# Patient Record
Sex: Female | Born: 1960
Health system: Southern US, Community
[De-identification: ages and names within clinical notes are randomized; demographics above are authoritative.]

## PROBLEM LIST (undated history)

## (undated) DIAGNOSIS — R87629 Unspecified abnormal cytological findings in specimens from vagina: Secondary | ICD-10-CM

## (undated) DIAGNOSIS — IMO0001 Reserved for inherently not codable concepts without codable children: Secondary | ICD-10-CM

## (undated) DIAGNOSIS — Z464 Encounter for fitting and adjustment of orthodontic device: Secondary | ICD-10-CM

## (undated) DIAGNOSIS — Z973 Presence of spectacles and contact lenses: Secondary | ICD-10-CM

## (undated) HISTORY — DX: Unspecified abnormal cytological findings in specimens from vagina: R87.629

---

## 2005-09-09 ENCOUNTER — Encounter: Payer: Self-pay | Admitting: Specialist

## 2005-09-12 ENCOUNTER — Encounter: Payer: Self-pay | Admitting: Specialist

## 2005-09-22 ENCOUNTER — Ambulatory Visit: Payer: Self-pay | Admitting: Obstetrics and Gynecology

## 2005-10-13 ENCOUNTER — Encounter: Payer: Self-pay | Admitting: Specialist

## 2007-02-06 ENCOUNTER — Ambulatory Visit: Payer: Self-pay | Admitting: Obstetrics and Gynecology

## 2007-11-22 ENCOUNTER — Ambulatory Visit: Payer: Self-pay | Admitting: Obstetrics and Gynecology

## 2008-04-30 ENCOUNTER — Ambulatory Visit: Payer: Self-pay | Admitting: Obstetrics and Gynecology

## 2009-01-22 ENCOUNTER — Ambulatory Visit: Payer: Self-pay | Admitting: Obstetrics and Gynecology

## 2009-05-08 ENCOUNTER — Ambulatory Visit: Payer: Self-pay | Admitting: Obstetrics and Gynecology

## 2009-10-12 ENCOUNTER — Ambulatory Visit: Payer: Self-pay | Admitting: Internal Medicine

## 2010-07-13 ENCOUNTER — Ambulatory Visit: Payer: Self-pay | Admitting: Obstetrics and Gynecology

## 2011-12-01 ENCOUNTER — Ambulatory Visit: Payer: Self-pay

## 2012-03-24 ENCOUNTER — Ambulatory Visit: Payer: Self-pay | Admitting: Obstetrics and Gynecology

## 2013-10-25 LAB — HM PAP SMEAR: HM Pap smear: NEGATIVE

## 2013-12-25 ENCOUNTER — Ambulatory Visit: Payer: Self-pay | Admitting: Obstetrics and Gynecology

## 2014-04-30 ENCOUNTER — Ambulatory Visit: Payer: Self-pay | Admitting: *Deleted

## 2015-05-14 ENCOUNTER — Other Ambulatory Visit: Payer: Self-pay | Admitting: Obstetrics and Gynecology

## 2015-05-14 DIAGNOSIS — Z1231 Encounter for screening mammogram for malignant neoplasm of breast: Secondary | ICD-10-CM

## 2015-05-28 ENCOUNTER — Ambulatory Visit
Admission: RE | Admit: 2015-05-28 | Discharge: 2015-05-28 | Disposition: A | Discharge status: Home / Self Care | Payer: 59 | Source: Ambulatory Visit | Attending: Obstetrics and Gynecology | Admitting: Obstetrics and Gynecology

## 2015-05-28 ENCOUNTER — Other Ambulatory Visit: Payer: Self-pay | Admitting: Obstetrics and Gynecology

## 2015-05-28 DIAGNOSIS — Z1231 Encounter for screening mammogram for malignant neoplasm of breast: Secondary | ICD-10-CM | POA: Diagnosis not present

## 2015-05-30 NOTE — Progress Notes (Signed)
Quick Note:  Please let her know I have reviewed her MMG and it is normal. ______

## 2015-06-02 ENCOUNTER — Encounter: Payer: Self-pay | Admitting: *Deleted

## 2015-07-02 ENCOUNTER — Telehealth: Payer: Self-pay | Admitting: Gastroenterology

## 2015-07-02 ENCOUNTER — Other Ambulatory Visit: Payer: Self-pay

## 2015-07-02 NOTE — Telephone Encounter (Signed)
Gastroenterology Pre-Procedure Review  Request Date: 08-22-2015     Patient would like early morning  Requesting Physician: Dr. Allen Norris  PATIENT REVIEW QUESTIONS: The patient responded to the following health history questions as indicated:    1. Are you having any GI issues? no 2. Do you have a personal history of Polyps? no 3. Do you have a family history of Colon Cancer or Polyps? no 4. Diabetes Mellitus? no 5. Joint replacements in the past 12 months?no 6. Major health problems in the past 3 months?no 7. Any artificial heart valves, MVP, or defibrillator?no    MEDICATIONS & ALLERGIES:    Patient reports the following regarding taking any anticoagulation/antiplatelet therapy:   Plavix, Coumadin, Eliquis, Xarelto, Lovenox, Pradaxa, Brilinta, or Effient? no Aspirin? no  Patient confirms/reports the following medications:  Vitamins No current outpatient prescriptions on file.   No current facility-administered medications for this visit.    Patient confirms/reports the following allergies: NO Allergies not on file  No orders of the defined types were placed in this encounter.    AUTHORIZATION INFORMATION Primary Insurance: [place] 1D#: Group #:  Secondary Insurance: 1D#: Group #:  SCHEDULE INFORMATION: Date: 08-22-2015 Time: Location:[facility]

## 2015-08-12 ENCOUNTER — Encounter: Payer: Self-pay | Admitting: *Deleted

## 2015-08-20 NOTE — Discharge Instructions (Signed)

## 2015-08-22 ENCOUNTER — Ambulatory Visit: Payer: 59 | Admitting: Anesthesiology

## 2015-08-22 ENCOUNTER — Ambulatory Visit
Admission: RE | Admit: 2015-08-22 | Discharge: 2015-08-22 | Disposition: A | Discharge status: Home / Self Care | Payer: 59 | Source: Ambulatory Visit | Attending: Gastroenterology | Admitting: Gastroenterology

## 2015-08-22 ENCOUNTER — Encounter
Admission: RE | Disposition: A | Discharge status: Home / Self Care | Payer: Self-pay | Source: Ambulatory Visit | Attending: Gastroenterology

## 2015-08-22 ENCOUNTER — Encounter: Payer: Self-pay | Admitting: *Deleted

## 2015-08-22 DIAGNOSIS — Z1211 Encounter for screening for malignant neoplasm of colon: Secondary | ICD-10-CM | POA: Diagnosis present

## 2015-08-22 DIAGNOSIS — K640 First degree hemorrhoids: Secondary | ICD-10-CM | POA: Diagnosis not present

## 2015-08-22 HISTORY — DX: Reserved for inherently not codable concepts without codable children: IMO0001

## 2015-08-22 HISTORY — PX: COLONOSCOPY WITH PROPOFOL: SHX5780

## 2015-08-22 HISTORY — DX: Encounter for fitting and adjustment of orthodontic device: Z46.4

## 2015-08-22 HISTORY — DX: Presence of spectacles and contact lenses: Z97.3

## 2015-08-22 SURGERY — COLONOSCOPY WITH PROPOFOL
Anesthesia: Monitor Anesthesia Care | Wound class: Contaminated

## 2015-08-22 MED ORDER — LIDOCAINE HCL (CARDIAC) 20 MG/ML IV SOLN
INTRAVENOUS | Status: DC | PRN
  Administered 2015-08-22: 50 mg via INTRAVENOUS

## 2015-08-22 MED ORDER — LACTATED RINGERS IV SOLN
INTRAVENOUS | Status: DC
  Administered 2015-08-22: 07:00:00 via INTRAVENOUS

## 2015-08-22 MED ORDER — OXYCODONE HCL 5 MG/5ML PO SOLN: 5.0000 mg | Freq: Once | ORAL | Status: DC | PRN

## 2015-08-22 MED ORDER — PROPOFOL 10 MG/ML IV BOLUS
INTRAVENOUS | Status: DC | PRN
  Administered 2015-08-22 (×2): 10 mg via INTRAVENOUS
  Administered 2015-08-22: 40 mg via INTRAVENOUS
  Administered 2015-08-22: 70 mg via INTRAVENOUS
  Administered 2015-08-22: 20 mg via INTRAVENOUS
  Administered 2015-08-22: 10 mg via INTRAVENOUS
  Administered 2015-08-22: 20 mg via INTRAVENOUS
  Administered 2015-08-22: 40 mg via INTRAVENOUS

## 2015-08-22 MED ORDER — STERILE WATER FOR IRRIGATION IR SOLN
Status: DC | PRN
  Administered 2015-08-22: 08:00:00

## 2015-08-22 MED ORDER — OXYCODONE HCL 5 MG PO TABS: 5.0000 mg | ORAL_TABLET | Freq: Once | ORAL | Status: DC | PRN

## 2015-08-22 SURGICAL SUPPLY — 28 items

## 2015-08-22 NOTE — H&P (Signed)
  [facility]  [address] [place], [place] [address] Phone: [phone] Fax : [phone]  Primary Care Physician:  Gennie Alma, CNM Primary Gastroenterologist:  Dr. Allen Norris  Pre-Procedure History & Physical: HPI:  Hannah Chavez is a 54 y.o. female is here for a screening colonoscopy.   Past Medical History  Diagnosis Date  . Wears contact lenses   . Orthodontics     Permanant retainer - top front    History reviewed. No pertinent past surgical history.  Prior to Admission medications   Medication Sig Start Date End Date Taking? Authorizing Provider  Calcium Citrate-Vitamin D (CALCIUM + D PO) Take by mouth.   Yes Historical Provider, MD  Multiple Vitamin (MULTI-VITAMIN DAILY PO) Take by mouth.   Yes Historical Provider, MD  Omega-3 Fatty Acids (FISH OIL PO) Take by mouth.   Yes Historical Provider, MD    Allergies as of 07/02/2015  . (Not on File)    History reviewed. No pertinent family history.  Social History   Social History  . Marital Status: Married    Spouse Name: N/A  . Number of Children: N/A  . Years of Education: N/A   Occupational History  . Not on file.   Social History Main Topics  . Smoking status: Never Smoker   . Smokeless tobacco: Not on file  . Alcohol Use: 4.2 oz/week    7 Glasses of wine per week  . Drug Use: Not on file  . Sexual Activity: Not on file   Other Topics Concern  . Not on file   Social History Narrative    Review of Systems: See HPI, otherwise negative ROS  Physical Exam: BP 108/61 mmHg  Pulse 47  Temp(Src) 97.9 F (36.6 C)  Resp 17  Ht 5\' 9"  (1.753 m)  Wt 129 lb (58.514 kg)  BMI 19.04 kg/m2  SpO2 100% General:   Alert,  pleasant and cooperative in NAD Head:  Normocephalic and atraumatic. Neck:  Supple; no masses or thyromegaly. Lungs:  Clear throughout to auscultation.    Heart:  Regular rate and rhythm. Abdomen:  Soft, nontender and nondistended. Normal bowel sounds, without  guarding, and without rebound.   Neurologic:  Alert and  oriented x4;  grossly normal neurologically.  Impression/Plan: Hannah Chavez is now here to undergo a screening colonoscopy.  Risks, benefits, and alternatives regarding colonoscopy have been reviewed with the patient.  Questions have been answered.  All parties agreeable.

## 2015-08-22 NOTE — Op Note (Signed)
[facility] Gastroenterology Patient Name: Hannah Chavez Procedure Date: 08/22/2015 7:40 AM MRN: [id number] Account #: [id number] Date of Birth: 1961-02-13 Admit Type: Outpatient Age: 54 Room: [facility] OR ROOM 01 Gender: Female Note Status: Finalized Procedure:         Colonoscopy Indications:       Screening for colorectal malignant neoplasm Providers:         Lucilla Lame, MD Medicines:         Propofol per Anesthesia Complications:     No immediate complications. Procedure:         Pre-Anesthesia Assessment:                    - Prior to the procedure, a History and Physical was                     performed, and patient medications and allergies were                     reviewed. The patient's tolerance of previous anesthesia                     was also reviewed. The risks and benefits of the procedure                     and the sedation options and risks were discussed with the                     patient. All questions were answered, and informed consent                     was obtained. Prior Anticoagulants: The patient has taken                     no previous anticoagulant or antiplatelet agents. ASA                     Grade Assessment: II - A patient with mild systemic                     disease. After reviewing the risks and benefits, the                     patient was deemed in satisfactory condition to undergo                     the procedure.                    After obtaining informed consent, the colonoscope was                     passed under direct vision. Throughout the procedure, the                     patient's blood pressure, pulse, and oxygen saturations                     were monitored continuously. The Olympus CF H180AL                     colonoscope (S#: [id number]) was introduced through the anus                     and advanced to the the cecum, identified by  appendiceal                     orifice and ileocecal valve. The  colonoscopy was performed                     without difficulty. The patient tolerated the procedure                     well. The quality of the bowel preparation was excellent. Findings:      The perianal and digital rectal examinations were normal.      Non-bleeding internal hemorrhoids were found during retroflexion. The       hemorrhoids were Grade I (internal hemorrhoids that do not prolapse). Impression:        - Non-bleeding internal hemorrhoids.                    - No specimens collected. Recommendation:    - Repeat colonoscopy in 10 years for screening unless any                     change in family history or lower GI problems. Procedure Code(s): --- Professional ---                    [phone], Colonoscopy, flexible; diagnostic, including                     collection of specimen(s) by brushing or washing, when                     performed (separate procedure) Diagnosis Code(s): --- Professional ---                    Z12.11, Encounter for screening for malignant neoplasm of                     colon CPT copyright 2014 American Medical Association. All rights reserved. The codes documented in this report are preliminary and upon coder review may  be revised to meet current compliance requirements. Lucilla Lame, MD 08/22/2015 7:58:09 AM This report has been signed electronically. Number of Addenda: 0 Note Initiated On: 08/22/2015 7:40 AM Scope Withdrawal Time: 0 hours 6 minutes 57 seconds  Total Procedure Duration: 0 hours 11 minutes 50 seconds       [facility]

## 2015-08-22 NOTE — Anesthesia Procedure Notes (Signed)
Procedure Name: MAC Performed by: Arbie Reisz Pre-anesthesia Checklist: Patient identified, Emergency Drugs available, Suction available, Timeout performed and Patient being monitored Patient Re-evaluated:Patient Re-evaluated prior to inductionOxygen Delivery Method: Nasal cannula Placement Confirmation: positive ETCO2       

## 2015-08-22 NOTE — Anesthesia Postprocedure Evaluation (Signed)
  Anesthesia Post-op Note  Patient: Hannah Chavez  Procedure(s) Performed: Procedure(s) with comments: COLONOSCOPY WITH PROPOFOL (N/A) - PLEASE LEAVE PT EARLY AM PER DEE  Anesthesia type:MAC  Patient location: PACU  Post pain: Pain level controlled  Post assessment: Post-op Vital signs reviewed, Patient's Cardiovascular Status Stable, Respiratory Function Stable, Patent Airway and No signs of Nausea or vomiting  Post vital signs: Reviewed and stable  Last Vitals:  Filed Vitals:   08/22/15 0815  BP: 96/51  Pulse: 55  Temp:   Resp: 13    Level of consciousness: awake, alert  and patient cooperative  Complications: No apparent anesthesia complications

## 2015-08-22 NOTE — Transfer of Care (Signed)
Immediate Anesthesia Transfer of Care Note  Patient: Hannah Chavez  Procedure(s) Performed: Procedure(s) with comments: COLONOSCOPY WITH PROPOFOL (N/A) - PLEASE LEAVE PT EARLY AM PER DEE  Patient Location: PACU  Anesthesia Type: MAC  Level of Consciousness: awake, alert  and patient cooperative  Airway and Oxygen Therapy: Patient Spontanous Breathing and Patient connected to supplemental oxygen  Post-op Assessment: Post-op Vital signs reviewed, Patient's Cardiovascular Status Stable, Respiratory Function Stable, Patent Airway and No signs of Nausea or vomiting  Post-op Vital Signs: Reviewed and stable  Complications: No apparent anesthesia complications

## 2015-08-22 NOTE — Anesthesia Preprocedure Evaluation (Signed)
Anesthesia Evaluation  Patient identified by MRN, date of birth, ID band  Reviewed: NPO status   History of Anesthesia Complications Negative for: history of anesthetic complications  Airway Mallampati: II  TM Distance: >3 FB Neck ROM: full    Dental no notable dental hx.    Pulmonary neg pulmonary ROS,    Pulmonary exam normal       Cardiovascular Exercise Tolerance: Good negative cardio ROS Normal cardiovascular exam    Neuro/Psych negative neurological ROS  negative psych ROS   GI/Hepatic negative GI ROS, Neg liver ROS,   Endo/Other  negative endocrine ROS  Renal/GU negative Renal ROS  negative genitourinary   Musculoskeletal   Abdominal   Peds  Hematology negative hematology ROS (+)   Anesthesia Other Findings   Reproductive/Obstetrics                             Anesthesia Physical Anesthesia Plan  ASA: I  Anesthesia Plan: MAC   Post-op Pain Management:    Induction:   Airway Management Planned:   Additional Equipment:   Intra-op Plan:   Post-operative Plan:   Informed Consent: I have reviewed the patients History and Physical, chart, labs and discussed the procedure including the risks, benefits and alternatives for the proposed anesthesia with the patient or authorized representative who has indicated his/her understanding and acceptance.     Plan Discussed with: CRNA  Anesthesia Plan Comments:         Anesthesia Quick Evaluation  

## 2015-08-25 ENCOUNTER — Encounter: Payer: Self-pay | Admitting: Gastroenterology

## 2015-10-16 ENCOUNTER — Encounter: Payer: Self-pay | Admitting: *Deleted

## 2015-10-31 ENCOUNTER — Encounter: Payer: Self-pay | Admitting: Obstetrics and Gynecology

## 2015-12-16 DIAGNOSIS — D229 Melanocytic nevi, unspecified: Secondary | ICD-10-CM | POA: Diagnosis not present

## 2015-12-16 DIAGNOSIS — L570 Actinic keratosis: Secondary | ICD-10-CM | POA: Diagnosis not present

## 2015-12-16 DIAGNOSIS — L814 Other melanin hyperpigmentation: Secondary | ICD-10-CM | POA: Diagnosis not present

## 2015-12-16 DIAGNOSIS — Z1283 Encounter for screening for malignant neoplasm of skin: Secondary | ICD-10-CM | POA: Diagnosis not present

## 2015-12-16 DIAGNOSIS — D485 Neoplasm of uncertain behavior of skin: Secondary | ICD-10-CM | POA: Diagnosis not present

## 2015-12-16 DIAGNOSIS — L578 Other skin changes due to chronic exposure to nonionizing radiation: Secondary | ICD-10-CM | POA: Diagnosis not present

## 2015-12-18 ENCOUNTER — Encounter: Payer: Self-pay | Admitting: Obstetrics and Gynecology

## 2015-12-25 ENCOUNTER — Ambulatory Visit (INDEPENDENT_AMBULATORY_CARE_PROVIDER_SITE_OTHER): Payer: 59 | Admitting: Obstetrics and Gynecology

## 2015-12-25 ENCOUNTER — Other Ambulatory Visit: Payer: Self-pay | Admitting: Obstetrics and Gynecology

## 2015-12-25 ENCOUNTER — Encounter: Payer: Self-pay | Admitting: Obstetrics and Gynecology

## 2015-12-25 VITALS — BP 107/69 | HR 69 | Ht 69.0 in | Wt 137.2 lb

## 2015-12-25 DIAGNOSIS — Z01419 Encounter for gynecological examination (general) (routine) without abnormal findings: Secondary | ICD-10-CM

## 2015-12-25 NOTE — Progress Notes (Signed)
Subjective:   Hannah Chavez is a 55 y.o. No obstetric history on file. Caucasian female here for a routine well-woman exam.  No LMP recorded. Patient is postmenopausal.    Current complaints: none PCP: ?       doesn't desire labs  Social History: Sexual: heterosexual Marital Status: married Living situation: with family Occupation: unknown occupation Tobacco/alcohol: no tobacco use Illicit drugs: no history of illicit drug use  The following portions of the patient's history were reviewed and updated as appropriate: allergies, current medications, past family history, past medical history, past social history, past surgical history and problem list.  Past Medical History Past Medical History  Diagnosis Date  . Vaginal Pap smear, abnormal     Past Surgical History History reviewed. No pertinent past surgical history.  Gynecologic History No obstetric history on file.  No LMP recorded. Patient is postmenopausal. Contraception: post menopausal status Last Pap: 2014. Results were: normal Last mammogram: 2016. Results were: normal  Obstetric History OB History  No data available    Current Medications No current outpatient prescriptions on file prior to visit.   No current facility-administered medications on file prior to visit.    Review of Systems Patient denies any headaches, blurred vision, shortness of breath, chest pain, abdominal pain, problems with bowel movements, urination, or intercourse.  Objective:  BP 107/69 mmHg  Pulse 69  Ht 5\' 9"  (1.753 m)  Wt 137 lb 3.2 oz (62.234 kg)  BMI 20.25 kg/m2 Physical Exam  General:  Well developed, well nourished, no acute distress. She is alert and oriented x3. Skin:  Warm and dry Neck:  Midline trachea, no thyromegaly or nodules Cardiovascular: Regular rate and rhythm, no murmur heard Lungs:  Effort normal, all lung fields clear to auscultation bilaterally Breasts:  No dominant palpable mass, retraction, or  nipple discharge Abdomen:  Soft, non tender, no hepatosplenomegaly or masses Pelvic:  External genitalia is normal in appearance.  The vagina is normal in appearance. The cervix is bulbous, no CMT.  Thin prep pap is done with HR HPV cotesting. Uterus is felt to be normal size, shape, and contour.  No adnexal masses or tenderness noted. Extremities:  No swelling or varicosities noted Psych:  She has a normal mood and affect  Assessment:   Healthy well-woman exam  Plan:  Pap obtained F/U 1 year for AE, or sooner if needed Mammogram scheuled Colonoscopy done 2016  Cassandra Mcmanaman Rockney Ghee, CNM

## 2015-12-25 NOTE — Patient Instructions (Signed)
Place annual gynecologic exam patient instructions here.

## 2015-12-31 LAB — CYTOLOGY - PAP

## 2016-08-10 ENCOUNTER — Other Ambulatory Visit: Payer: Self-pay | Admitting: Obstetrics and Gynecology

## 2016-08-10 ENCOUNTER — Ambulatory Visit
Admission: RE | Admit: 2016-08-10 | Discharge: 2016-08-10 | Disposition: A | Discharge status: Home / Self Care | Payer: 59 | Source: Ambulatory Visit | Attending: Obstetrics and Gynecology | Admitting: Obstetrics and Gynecology

## 2016-08-10 DIAGNOSIS — Z01419 Encounter for gynecological examination (general) (routine) without abnormal findings: Secondary | ICD-10-CM

## 2016-08-10 DIAGNOSIS — Z1231 Encounter for screening mammogram for malignant neoplasm of breast: Secondary | ICD-10-CM | POA: Diagnosis not present

## 2016-10-29 ENCOUNTER — Encounter: Payer: Self-pay | Admitting: Gastroenterology

## 2016-12-14 DIAGNOSIS — H5203 Hypermetropia, bilateral: Secondary | ICD-10-CM | POA: Diagnosis not present

## 2016-12-21 DIAGNOSIS — L570 Actinic keratosis: Secondary | ICD-10-CM | POA: Diagnosis not present

## 2016-12-21 DIAGNOSIS — L814 Other melanin hyperpigmentation: Secondary | ICD-10-CM | POA: Diagnosis not present

## 2016-12-21 DIAGNOSIS — L219 Seborrheic dermatitis, unspecified: Secondary | ICD-10-CM | POA: Diagnosis not present

## 2016-12-21 DIAGNOSIS — L821 Other seborrheic keratosis: Secondary | ICD-10-CM | POA: Diagnosis not present

## 2016-12-21 DIAGNOSIS — D229 Melanocytic nevi, unspecified: Secondary | ICD-10-CM | POA: Diagnosis not present

## 2016-12-21 DIAGNOSIS — Z1283 Encounter for screening for malignant neoplasm of skin: Secondary | ICD-10-CM | POA: Diagnosis not present

## 2016-12-21 DIAGNOSIS — D485 Neoplasm of uncertain behavior of skin: Secondary | ICD-10-CM | POA: Diagnosis not present

## 2016-12-21 DIAGNOSIS — L700 Acne vulgaris: Secondary | ICD-10-CM | POA: Diagnosis not present

## 2016-12-29 ENCOUNTER — Encounter: Payer: 59 | Admitting: Obstetrics and Gynecology

## 2017-01-14 ENCOUNTER — Encounter: Payer: 59 | Admitting: Obstetrics and Gynecology

## 2017-01-14 ENCOUNTER — Ambulatory Visit (INDEPENDENT_AMBULATORY_CARE_PROVIDER_SITE_OTHER): Payer: 59 | Admitting: Obstetrics and Gynecology

## 2017-01-14 ENCOUNTER — Encounter: Payer: Self-pay | Admitting: Obstetrics and Gynecology

## 2017-01-14 VITALS — BP 120/54 | HR 49 | Ht 69.0 in | Wt 137.9 lb

## 2017-01-14 DIAGNOSIS — Z01419 Encounter for gynecological examination (general) (routine) without abnormal findings: Secondary | ICD-10-CM

## 2017-01-14 NOTE — Progress Notes (Signed)
Subjective:   Hannah Chavez is a 56 y.o. G69P2 Caucasian female here for a routine well-woman exam.  No LMP recorded. Patient is postmenopausal.    Current complaints: none PCP: me       does desire labs  Social History: Sexual: heterosexual Marital Status: married Living situation: with family Occupation: at [employer] Tobacco/alcohol: no tobacco use Illicit drugs: no history of illicit drug use  The following portions of the patient's history were reviewed and updated as appropriate: allergies, current medications, past family history, past medical history, past social history, past surgical history and problem list.  Past Medical History Past Medical History:  Diagnosis Date  . Orthodontics    Permanant retainer - top front  . Vaginal Pap smear, abnormal   . Wears contact lenses     Past Surgical History Past Surgical History:  Procedure Laterality Date  . COLONOSCOPY WITH PROPOFOL N/A 08/22/2015   Procedure: COLONOSCOPY WITH PROPOFOL;  Surgeon: Lucilla Lame, MD;  Location: [place];  Service: Endoscopy;  Laterality: N/A;  PLEASE LEAVE PT EARLY AM PER DEE    Gynecologic History G2P2  No LMP recorded. Patient is postmenopausal. Contraception: post menopausal status Last Pap: 2017. Results were: normal Last mammogram: 2017. Results were: normal   Obstetric History OB History  Gravida Para Term Preterm AB Living  2 2          SAB TAB Ectopic Multiple Live Births               # Outcome Date GA Lbr Len/2nd Weight Sex Delivery Anes PTL Lv  2 Para 2000    F Vag-Spont     1 Para 1994    F Vag-Spont         Current Medications Current Outpatient Prescriptions on File Prior to Visit  Medication Sig Dispense Refill  . Calcium Citrate-Vitamin D (CALCIUM + D PO) Take by mouth.    . ergocalciferol (VITAMIN D2) 50000 units capsule Take 50,000 Units by mouth once a week.    . Multiple Vitamin (MULTI-VITAMIN DAILY PO) Take by mouth.    . MULTIPLE VITAMIN PO  Take by mouth.    . Omega-3 Fatty Acids (FISH OIL) 1000 MG CAPS Take by mouth.    . Omega-3 Fatty Acids (FISH OIL PO) Take by mouth.     No current facility-administered medications on file prior to visit.     Review of Systems Patient denies any headaches, blurred vision, shortness of breath, chest pain, abdominal pain, problems with bowel movements, urination, or intercourse.  Objective:  BP (!) 120/54   Pulse (!) 49   Ht 5\' 9"  (1.753 m)   Wt 137 lb 14.4 oz (62.6 kg)   BMI 20.36 kg/m  Physical Exam  General:  Well developed, well nourished, no acute distress. She is alert and oriented x3. Skin:  Warm and dry Neck:  Midline trachea, no thyromegaly or nodules Cardiovascular: Regular rate and rhythm, no murmur heard Lungs:  Effort normal, all lung fields clear to auscultation bilaterally Breasts:  No dominant palpable mass, retraction, or nipple discharge Abdomen:  Soft, non tender, no hepatosplenomegaly or masses Pelvic:  External genitalia is normal in appearance.  The vagina is normal in appearance with mild atrophy. The cervix is bulbous, no CMT.  Thin prep pap is not done. Uterus is felt to be normal size, shape, and contour.  No adnexal masses or tenderness noted. Extremities:  No swelling or varicosities noted Psych:  She has a normal  mood and affect  Assessment:   Healthy well-woman exam S/p menopausal  Plan:  Routine screening labs F/U 1 year for Ae, or sooner if needed Mammogram ordered  Melody Rockney Ghee, CNM

## 2017-01-15 LAB — LIPID PANEL
Chol/HDL Ratio: 2.7 ratio units (ref 0.0–4.4)
Cholesterol, Total: 202 mg/dL — ABNORMAL HIGH (ref 100–199)
HDL: 75 mg/dL (ref >39)
LDL Calculated: 105 mg/dL — ABNORMAL HIGH (ref 0–99)
TRIGLYCERIDES: 112 mg/dL (ref 0–149)
VLDL CHOLESTEROL CAL: 22 mg/dL (ref 5–40)

## 2017-01-15 LAB — COMPREHENSIVE METABOLIC PANEL
A/G RATIO: 1.7 (ref 1.2–2.2)
ALBUMIN: 4.4 g/dL (ref 3.5–5.5)
ALT: 18 IU/L (ref 0–32)
AST: 31 IU/L (ref 0–40)
Alkaline Phosphatase: 47 IU/L (ref 39–117)
BILIRUBIN TOTAL: 0.4 mg/dL (ref 0.0–1.2)
BUN / CREAT RATIO: 20 (ref 9–23)
BUN: 15 mg/dL (ref 6–24)
CHLORIDE: 97 mmol/L (ref 96–106)
CO2: 25 mmol/L (ref 18–29)
Calcium: 9.3 mg/dL (ref 8.7–10.2)
Creatinine, Ser: 0.76 mg/dL (ref 0.57–1.00)
GFR calc non Af Amer: 89 mL/min/{1.73_m2} (ref >59)
GFR, EST AFRICAN AMERICAN: 103 mL/min/{1.73_m2} (ref >59)
Globulin, Total: 2.6 g/dL (ref 1.5–4.5)
Glucose: 86 mg/dL (ref 65–99)
POTASSIUM: 4.3 mmol/L (ref 3.5–5.2)
SODIUM: 139 mmol/L (ref 134–144)
TOTAL PROTEIN: 7 g/dL (ref 6.0–8.5)

## 2017-01-15 LAB — VITAMIN D 25 HYDROXY (VIT D DEFICIENCY, FRACTURES): Vit D, 25-Hydroxy: 37.1 ng/mL (ref 30.0–100.0)

## 2017-01-24 ENCOUNTER — Encounter: Payer: Self-pay | Admitting: Obstetrics and Gynecology

## 2017-08-26 ENCOUNTER — Ambulatory Visit
Admission: RE | Admit: 2017-08-26 | Discharge: 2017-08-26 | Disposition: A | Discharge status: Home / Self Care | Payer: 59 | Source: Ambulatory Visit | Attending: Obstetrics and Gynecology | Admitting: Obstetrics and Gynecology

## 2017-08-26 DIAGNOSIS — Z01419 Encounter for gynecological examination (general) (routine) without abnormal findings: Secondary | ICD-10-CM

## 2017-08-26 DIAGNOSIS — Z1231 Encounter for screening mammogram for malignant neoplasm of breast: Secondary | ICD-10-CM | POA: Diagnosis not present

## 2017-12-15 DIAGNOSIS — H5203 Hypermetropia, bilateral: Secondary | ICD-10-CM | POA: Diagnosis not present

## 2017-12-27 DIAGNOSIS — D485 Neoplasm of uncertain behavior of skin: Secondary | ICD-10-CM | POA: Diagnosis not present

## 2017-12-27 DIAGNOSIS — L578 Other skin changes due to chronic exposure to nonionizing radiation: Secondary | ICD-10-CM | POA: Diagnosis not present

## 2017-12-27 DIAGNOSIS — L814 Other melanin hyperpigmentation: Secondary | ICD-10-CM | POA: Diagnosis not present

## 2017-12-27 DIAGNOSIS — Z1283 Encounter for screening for malignant neoplasm of skin: Secondary | ICD-10-CM | POA: Diagnosis not present

## 2017-12-27 DIAGNOSIS — L570 Actinic keratosis: Secondary | ICD-10-CM | POA: Diagnosis not present

## 2017-12-27 DIAGNOSIS — D229 Melanocytic nevi, unspecified: Secondary | ICD-10-CM | POA: Diagnosis not present

## 2018-01-20 ENCOUNTER — Encounter: Payer: 59 | Admitting: Obstetrics and Gynecology

## 2018-05-04 ENCOUNTER — Ambulatory Visit (INDEPENDENT_AMBULATORY_CARE_PROVIDER_SITE_OTHER): Payer: 59 | Admitting: Obstetrics and Gynecology

## 2018-05-04 ENCOUNTER — Encounter: Payer: Self-pay | Admitting: Obstetrics and Gynecology

## 2018-05-04 VITALS — BP 112/62 | HR 53 | Ht 69.0 in | Wt 132.7 lb

## 2018-05-04 DIAGNOSIS — Z01419 Encounter for gynecological examination (general) (routine) without abnormal findings: Secondary | ICD-10-CM

## 2018-05-04 NOTE — Progress Notes (Signed)
Subjective:   Hannah Chavez is a 57 y.o. G56P2 Caucasian female here for a routine well-woman exam.  No LMP recorded. Patient is postmenopausal.    Current complaints: none PCP: me       does desire labs  Social History: Sexual: heterosexual Marital Status: married Living situation: with family Occupation: administration at [employer] Tobacco/alcohol: no tobacco use Illicit drugs: no history of illicit drug use  The following portions of the patient's history were reviewed and updated as appropriate: allergies, current medications, past family history, past medical history, past social history, past surgical history and problem list.  Past Medical History Past Medical History:  Diagnosis Date  . Orthodontics    Permanant retainer - top front  . Vaginal Pap smear, abnormal   . Wears contact lenses     Past Surgical History Past Surgical History:  Procedure Laterality Date  . COLONOSCOPY WITH PROPOFOL N/A 08/22/2015   Procedure: COLONOSCOPY WITH PROPOFOL;  Surgeon: Lucilla Lame, MD;  Location: [place];  Service: Endoscopy;  Laterality: N/A;  PLEASE LEAVE PT EARLY AM PER DEE    Gynecologic History G2P2  No LMP recorded. Patient is postmenopausal. Contraception: post menopausal status Last Pap: 2017. Results were: normal Last mammogram: 08/2017. Results were: normal  Obstetric History OB History  Gravida Para Term Preterm AB Living  2 2          SAB TAB Ectopic Multiple Live Births               # Outcome Date GA Lbr Len/2nd Weight Sex Delivery Anes PTL Lv  2 Para 2000    F Vag-Spont     1 Para 1994    F Vag-Spont       Current Medications Current Outpatient Medications on File Prior to Visit  Medication Sig Dispense Refill  . Biotin 10 MG TABS Take by mouth.    . Calcium Citrate-Vitamin D (CALCIUM + D PO) Take by mouth.    . ergocalciferol (VITAMIN D2) 50000 units capsule Take 50,000 Units by mouth once a week.    . Multiple Vitamin (MULTI-VITAMIN  DAILY PO) Take by mouth.    . Omega-3 Fatty Acids (FISH OIL PO) Take by mouth.     No current facility-administered medications on file prior to visit.     Review of Systems Patient denies any headaches, blurred vision, shortness of breath, chest pain, abdominal pain, problems with bowel movements, urination, or intercourse.  Objective:  BP 112/62   Pulse (!) 53   Ht 5\' 9"  (1.753 m)   Wt 132 lb 11.2 oz (60.2 kg)   BMI 19.60 kg/m  Physical Exam  General:  Well developed, well nourished, no acute distress. She is alert and oriented x3. Skin:  Warm and dry Neck:  Midline trachea, no thyromegaly or nodules Cardiovascular: Regular rate and rhythm, no murmur heard Lungs:  Effort normal, all lung fields clear to auscultation bilaterally Breasts:  No dominant palpable mass, retraction, or nipple discharge Abdomen:  Soft, non tender, no hepatosplenomegaly or masses Pelvic:  External genitalia is normal in appearance.  The vagina is normal in appearance. The cervix is bulbous, no CMT.  Thin prep pap is not done. Uterus is felt to be normal size, shape, and contour.  No adnexal masses or tenderness noted. Extremities:  No swelling or varicosities noted Psych:  She has a normal mood and affect  Assessment:   Healthy well-woman exam S/p menopause  Plan:  Labs obtained -will follow up  accordingly F/U 1 year for AE, or sooner if needed Mammogram & screening BDS ordered Colonoscopy not due (done 2016)  Eldred Sooy Rockney Ghee, CNM

## 2018-05-05 LAB — COMPREHENSIVE METABOLIC PANEL
ALBUMIN: 4.7 g/dL (ref 3.5–5.5)
ALT: 17 IU/L (ref 0–32)
AST: 29 IU/L (ref 0–40)
Albumin/Globulin Ratio: 2 (ref 1.2–2.2)
Alkaline Phosphatase: 46 IU/L (ref 39–117)
BUN / CREAT RATIO: 14 (ref 9–23)
BUN: 11 mg/dL (ref 6–24)
Bilirubin Total: 0.4 mg/dL (ref 0.0–1.2)
CALCIUM: 9.9 mg/dL (ref 8.7–10.2)
CHLORIDE: 103 mmol/L (ref 96–106)
CO2: 27 mmol/L (ref 20–29)
Creatinine, Ser: 0.77 mg/dL (ref 0.57–1.00)
GFR calc non Af Amer: 87 mL/min/{1.73_m2} (ref >59)
GFR, EST AFRICAN AMERICAN: 101 mL/min/{1.73_m2} (ref >59)
GLUCOSE: 73 mg/dL (ref 65–99)
Globulin, Total: 2.4 g/dL (ref 1.5–4.5)
POTASSIUM: 3.9 mmol/L (ref 3.5–5.2)
Sodium: 142 mmol/L (ref 134–144)
Total Protein: 7.1 g/dL (ref 6.0–8.5)

## 2018-05-05 LAB — LIPID PANEL
CHOLESTEROL TOTAL: 196 mg/dL (ref 100–199)
Chol/HDL Ratio: 2.5 ratio (ref 0.0–4.4)
HDL: 80 mg/dL (ref >39)
LDL CALC: 102 mg/dL — AB (ref 0–99)
Triglycerides: 70 mg/dL (ref 0–149)
VLDL Cholesterol Cal: 14 mg/dL (ref 5–40)

## 2018-05-05 LAB — VITAMIN D 25 HYDROXY (VIT D DEFICIENCY, FRACTURES): Vit D, 25-Hydroxy: 47.4 ng/mL (ref 30.0–100.0)

## 2018-09-13 ENCOUNTER — Other Ambulatory Visit: Payer: Self-pay | Admitting: Obstetrics and Gynecology

## 2018-09-13 DIAGNOSIS — Z1231 Encounter for screening mammogram for malignant neoplasm of breast: Secondary | ICD-10-CM

## 2018-10-11 ENCOUNTER — Ambulatory Visit
Admission: RE | Admit: 2018-10-11 | Discharge: 2018-10-11 | Disposition: A | Discharge status: Home / Self Care | Payer: 59 | Source: Ambulatory Visit | Attending: Obstetrics and Gynecology | Admitting: Obstetrics and Gynecology

## 2018-10-11 DIAGNOSIS — M8589 Other specified disorders of bone density and structure, multiple sites: Secondary | ICD-10-CM | POA: Diagnosis not present

## 2018-10-11 DIAGNOSIS — Z01419 Encounter for gynecological examination (general) (routine) without abnormal findings: Secondary | ICD-10-CM | POA: Diagnosis not present

## 2018-10-11 DIAGNOSIS — Z780 Asymptomatic menopausal state: Secondary | ICD-10-CM | POA: Diagnosis not present

## 2018-10-11 DIAGNOSIS — Z1231 Encounter for screening mammogram for malignant neoplasm of breast: Secondary | ICD-10-CM | POA: Insufficient documentation

## 2019-02-19 DIAGNOSIS — H00025 Hordeolum internum left lower eyelid: Secondary | ICD-10-CM | POA: Diagnosis not present

## 2019-05-10 ENCOUNTER — Encounter: Payer: 59 | Admitting: Obstetrics and Gynecology

## 2019-05-16 ENCOUNTER — Encounter: Payer: Self-pay | Admitting: Obstetrics and Gynecology

## 2019-05-16 ENCOUNTER — Ambulatory Visit (INDEPENDENT_AMBULATORY_CARE_PROVIDER_SITE_OTHER): Payer: 59 | Admitting: Obstetrics and Gynecology

## 2019-05-16 ENCOUNTER — Other Ambulatory Visit: Payer: Self-pay

## 2019-05-16 VITALS — BP 105/61 | HR 58 | Ht 69.0 in | Wt 133.0 lb

## 2019-05-16 DIAGNOSIS — Z01419 Encounter for gynecological examination (general) (routine) without abnormal findings: Secondary | ICD-10-CM

## 2019-05-16 NOTE — Progress Notes (Signed)
Subjective:   Hannah Chavez is a 58 y.o. G68P2 Caucasian female here for a routine well-woman exam.  No LMP recorded. Patient is postmenopausal.    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  Past Medical History Past Medical History:  Diagnosis Date  . Orthodontics    Permanant retainer - top front  . Vaginal Pap smear, abnormal   . Wears contact lenses     Past Surgical History Past Surgical History:  Procedure Laterality Date  . COLONOSCOPY WITH PROPOFOL N/A 08/22/2015   Procedure: COLONOSCOPY WITH PROPOFOL;  Surgeon: Lucilla Lame, MD;  Location: [place];  Service: Endoscopy;  Laterality: N/A;  PLEASE LEAVE PT EARLY AM PER DEE    Gynecologic History G2P2  No LMP recorded. Patient is postmenopausal. Contraception: post menopausal status Last Pap: 2017. Results were: normal Last mammogram: 09/2018. Results were: normal   Obstetric History OB History  Gravida Para Term Preterm AB Living  2 2          SAB TAB Ectopic Multiple Live Births               # Outcome Date GA Lbr Len/2nd Weight Sex Delivery Anes PTL Lv  2 Para 2000    F Vag-Spont     1 Para 1994    F Vag-Spont       Current Medications Current Outpatient Medications on File Prior to Visit  Medication Sig Dispense Refill  . Biotin 10 MG TABS Take by mouth.    . Calcium Citrate-Vitamin D (CALCIUM + D PO) Take by mouth.    . Omega-3 Fatty Acids (FISH OIL PO) Take by mouth.    . ergocalciferol (VITAMIN D2)  50000 units capsule Take 50,000 Units by mouth once a week.    . Multiple Vitamin (MULTI-VITAMIN DAILY PO) Take by mouth.     No current facility-administered medications on file prior to visit.     Review of Systems Patient denies any headaches, blurred vision, shortness of breath, chest pain, abdominal pain, problems with bowel movements, urination, or intercourse.  Objective:  BP 105/61   Pulse (!) 58   Ht 5\' 9"  (1.753 m)   Wt 133 lb (60.3 kg)   BMI 19.64 kg/m  Physical Exam  General:  Well developed, well nourished, no acute distress. She is alert and oriented x3. Skin:  Warm and dry Neck:  Midline trachea, no thyromegaly or nodules Cardiovascular: Regular rate and rhythm, no murmur heard Lungs:  Effort normal, all lung fields clear to auscultation bilaterally Breasts:  No dominant palpable mass, retraction, or nipple discharge Abdomen:  Soft, non tender, no hepatosplenomegaly or masses Pelvic:  External genitalia is normal in appearance.  The vagina is normal in appearance. The cervix is bulbous, no CMT.  Thin prep pap is not done . Uterus is felt to be normal size, shape, and contour.  No adnexal masses or tenderness noted. Extremities:  No swelling or varicosities noted Psych:  She has a normal mood and affect  Assessment:   Healthy well-woman exam  Plan:  Labs obtained-will follow up accordingly F/U 1 year for AE, or sooner if needed Mammogram ordered  Yesha Muchow Rockney Ghee, CNM

## 2019-05-17 LAB — COMPREHENSIVE METABOLIC PANEL
ALT: 17 IU/L (ref 0–32)
AST: 33 IU/L (ref 0–40)
Albumin/Globulin Ratio: 2.3 — ABNORMAL HIGH (ref 1.2–2.2)
Albumin: 5 g/dL — ABNORMAL HIGH (ref 3.8–4.9)
Alkaline Phosphatase: 46 IU/L (ref 39–117)
BUN/Creatinine Ratio: 14 (ref 9–23)
BUN: 10 mg/dL (ref 6–24)
Bilirubin Total: 0.4 mg/dL (ref 0.0–1.2)
CO2: 26 mmol/L (ref 20–29)
Calcium: 9.7 mg/dL (ref 8.7–10.2)
Chloride: 100 mmol/L (ref 96–106)
Creatinine, Ser: 0.71 mg/dL (ref 0.57–1.00)
GFR calc Af Amer: 110 mL/min/{1.73_m2} (ref >59)
GFR calc non Af Amer: 96 mL/min/{1.73_m2} (ref >59)
Globulin, Total: 2.2 g/dL (ref 1.5–4.5)
Glucose: 81 mg/dL (ref 65–99)
Potassium: 4.1 mmol/L (ref 3.5–5.2)
Sodium: 140 mmol/L (ref 134–144)
Total Protein: 7.2 g/dL (ref 6.0–8.5)

## 2019-05-17 LAB — LIPID PANEL
Chol/HDL Ratio: 2.8 ratio (ref 0.0–4.4)
Cholesterol, Total: 234 mg/dL — ABNORMAL HIGH (ref 100–199)
HDL: 85 mg/dL (ref >39)
LDL Calculated: 135 mg/dL — ABNORMAL HIGH (ref 0–99)
Triglycerides: 70 mg/dL (ref 0–149)
VLDL Cholesterol Cal: 14 mg/dL (ref 5–40)

## 2019-05-17 LAB — HEMOGLOBIN A1C
Est. average glucose Bld gHb Est-mCnc: 105 mg/dL
Hgb A1c MFr Bld: 5.3 % (ref 4.8–5.6)

## 2019-05-21 ENCOUNTER — Telehealth: Payer: Self-pay

## 2019-05-21 NOTE — Telephone Encounter (Signed)
Per [place] information sent to patient.

## 2019-08-31 ENCOUNTER — Ambulatory Visit
Admission: EM | Admit: 2019-08-31 | Discharge: 2019-08-31 | Disposition: A | Discharge status: Home / Self Care | Payer: 59 | Attending: Family Medicine | Admitting: Family Medicine

## 2019-08-31 ENCOUNTER — Encounter: Payer: Self-pay | Admitting: Emergency Medicine

## 2019-08-31 ENCOUNTER — Other Ambulatory Visit: Payer: Self-pay

## 2019-08-31 DIAGNOSIS — M436 Torticollis: Secondary | ICD-10-CM

## 2019-08-31 MED ORDER — MELOXICAM 15 MG PO TABS: 15.0000 mg | ORAL_TABLET | Freq: Every day | ORAL | 0 refills | Status: DC | PRN

## 2019-08-31 MED ORDER — METAXALONE 800 MG PO TABS: 800.0000 mg | ORAL_TABLET | Freq: Three times a day (TID) | ORAL | 0 refills | Status: DC | PRN

## 2019-08-31 NOTE — Discharge Instructions (Signed)
Rest.  Heat.  Medication as prescribed.  Take care  Dr. Cherilynn Schomburg  

## 2019-08-31 NOTE — ED Provider Notes (Signed)
[facility]    CSN: [id number] Arrival date & time: 08/31/19  1318  History   Chief Complaint Chief Complaint  Patient presents with  . Neck Pain    APPT   HPI  58 year old female presents with acute neck pain.  Patient reports a 2-day history of acute neck pain.  Pain is predominantly located posteriorly.  She reports associated stiffness and decreased range of motion particularly in the left rotation.  She also has limited range of motion in right rotation.  She has taken Advil with some improvement.  No other medications or interventions tried.  She believes that this is muscular.  She attributes it to recent exercise.  No radicular symptoms.  Exacerbated by activity.  She rates her pain as 6/10 in severity.  No other associated symptoms.   PMH, Surgical Hx, Family Hx, Social History reviewed and updated as below.  Past Medical History:  Diagnosis Date  . Orthodontics    Permanant retainer - top front  . Vaginal Pap smear, abnormal   . Wears contact lenses    Patient Active Problem List   Diagnosis Date Noted  . Special screening for malignant neoplasms, colon    Past Surgical History:  Procedure Laterality Date  . COLONOSCOPY WITH PROPOFOL N/A 08/22/2015   Procedure: COLONOSCOPY WITH PROPOFOL;  Surgeon: Lucilla Lame, MD;  Location: [facility];  Service: Endoscopy;  Laterality: N/A;  PLEASE LEAVE PT EARLY AM PER DEE    OB History    Gravida  2   Para  2   Term      Preterm      AB      Living        SAB      TAB      Ectopic      Multiple      Live Births             Home Medications    Prior to Admission medications   Medication Sig Start Date End Date Taking? Authorizing Provider  Biotin 10 MG TABS Take by mouth.   Yes [provider]  Calcium Citrate-Vitamin D (CALCIUM + D PO) Take by mouth.   Yes [provider]  Multiple Vitamin (MULTI-VITAMIN DAILY PO) Take by mouth.   Yes [provider]   Omega-3 Fatty Acids (FISH OIL PO) Take by mouth.   Yes [provider]  ergocalciferol (VITAMIN D2) 50000 units capsule Take 50,000 Units by mouth once a week.    [provider]  meloxicam (MOBIC) 15 MG tablet Take 1 tablet (15 mg total) by mouth daily as needed for pain. 08/31/19   Coral Spikes, DO  metaxalone (SKELAXIN) 800 MG tablet Take 1 tablet (800 mg total) by mouth 3 (three) times daily as needed for muscle spasms (Neck pain). 08/31/19   Coral Spikes, DO    Family History Family History  Problem Relation Age of Onset  . Breast cancer Maternal Grandmother 17  . Scoliosis Mother   . Cardiomyopathy Father   . COPD Father   . Emphysema Father     Social History Social History   Tobacco Use  . Smoking status: Former Smoker    Quit date: 08/30/1989    Years since quitting: 30.0  . Smokeless tobacco: Never Used  Substance Use Topics  . Alcohol use: Yes    Comment: occas  . Drug use: No     Allergies   Patient has no known  allergies.   Review of Systems Review of Systems  Constitutional: Negative.   Musculoskeletal: Positive for neck pain and neck stiffness.   Physical Exam Triage Vital Signs ED Triage Vitals  Enc Vitals Group     BP 08/31/19 1328 (!) 127/95     Pulse Rate 08/31/19 1328 78     Resp 08/31/19 1328 18     Temp 08/31/19 1328 98.7 F (37.1 C)     Temp Source 08/31/19 1328 Oral     SpO2 08/31/19 1328 100 %     Weight 08/31/19 1327 133 lb (60.3 kg)     Height 08/31/19 1327 5\' 9"  (1.753 m)     Head Circumference --      Peak Flow --      Pain Score 08/31/19 1327 6     Pain Loc --      Pain Edu? --      Excl. in [place]? --    Updated Vital Signs BP (!) 127/95 (BP Location: Left Arm)   Pulse 78   Temp 98.7 F (37.1 C) (Oral)   Resp 18   Ht 5\' 9"  (1.753 m)   Wt 60.3 kg   SpO2 100%   BMI 19.64 kg/m   Visual Acuity Right Eye Distance:   Left Eye Distance:   Bilateral Distance:    Right Eye Near:   Left Eye Near:     Bilateral Near:     Physical Exam Vitals signs and nursing note reviewed.  Constitutional:      General: She is not in acute distress.    Appearance: Normal appearance. She is not ill-appearing.  HENT:     Head: Normocephalic and atraumatic.  Eyes:     General:        Right eye: No discharge.        Left eye: No discharge.     Conjunctiva/sclera: Conjunctivae normal.  Neck:     Comments: No discrete area of tenderness.  Decreased range of motion in left and right rotation. Cardiovascular:     Rate and Rhythm: Normal rate and regular rhythm.     Heart sounds: No murmur.  Pulmonary:     Effort: Pulmonary effort is normal.     Breath sounds: Normal breath sounds. No wheezing, rhonchi or rales.  Neurological:     Mental Status: She is alert.  Psychiatric:        Mood and Affect: Mood normal.        Behavior: Behavior normal.    UC Treatments / Results  Labs (all labs ordered are listed, but only abnormal results are displayed) Labs Reviewed - No data to display  EKG   Radiology No results found.  Procedures Procedures (including critical care time)  Medications Ordered in UC Medications - No data to display  Initial Impression / Assessment and Plan / UC Course  I have reviewed the triage vital signs and the nursing notes.  Pertinent labs & imaging results that were available during my care of the patient were reviewed by me and considered in my medical decision making (see chart for details).    58 year old female presents with torticollis.  Treated with meloxicam and Skelaxin.  Advised heat.  Final Clinical Impressions(s) / UC Diagnoses   Final diagnoses:  Torticollis, acute     Discharge Instructions     Rest.  Heat.  Medication as prescribed.  Take care  Dr. Lacinda Axon    ED Prescriptions    Medication Sig Dispense  Auth. Provider   meloxicam (MOBIC) 15 MG tablet Take 1 tablet (15 mg total) by mouth daily as needed for pain. 30 tablet Delio Slates  G, DO   metaxalone (SKELAXIN) 800 MG tablet Take 1 tablet (800 mg total) by mouth 3 (three) times daily as needed for muscle spasms (Neck pain). 30 tablet Coral Spikes, DO     PDMP not reviewed this encounter.   Coral Spikes, [place] 08/31/19 1552

## 2019-08-31 NOTE — ED Triage Notes (Signed)
Patient in today c/o 2 day history of neck pain and stiffness getting worse. Patient states she isn't able to turn her neck. She states it feels very muscular.

## 2019-09-14 ENCOUNTER — Other Ambulatory Visit: Payer: Self-pay | Admitting: Obstetrics and Gynecology

## 2019-09-14 DIAGNOSIS — Z1231 Encounter for screening mammogram for malignant neoplasm of breast: Secondary | ICD-10-CM

## 2019-10-23 ENCOUNTER — Ambulatory Visit
Admission: RE | Admit: 2019-10-23 | Discharge: 2019-10-23 | Disposition: A | Discharge status: Home / Self Care | Payer: 59 | Source: Ambulatory Visit | Attending: Obstetrics and Gynecology | Admitting: Obstetrics and Gynecology

## 2019-10-23 DIAGNOSIS — Z1231 Encounter for screening mammogram for malignant neoplasm of breast: Secondary | ICD-10-CM | POA: Insufficient documentation

## 2020-01-17 DIAGNOSIS — L578 Other skin changes due to chronic exposure to nonionizing radiation: Secondary | ICD-10-CM | POA: Diagnosis not present

## 2020-02-13 DIAGNOSIS — H5203 Hypermetropia, bilateral: Secondary | ICD-10-CM | POA: Diagnosis not present

## 2020-05-16 ENCOUNTER — Encounter: Payer: 59 | Admitting: Obstetrics and Gynecology

## 2020-05-20 ENCOUNTER — Encounter: Payer: 59 | Admitting: Obstetrics and Gynecology

## 2020-08-19 ENCOUNTER — Encounter: Payer: Self-pay | Admitting: Obstetrics and Gynecology

## 2020-08-19 ENCOUNTER — Ambulatory Visit (INDEPENDENT_AMBULATORY_CARE_PROVIDER_SITE_OTHER): Payer: 59 | Admitting: Obstetrics and Gynecology

## 2020-08-19 ENCOUNTER — Other Ambulatory Visit: Payer: Self-pay

## 2020-08-19 ENCOUNTER — Other Ambulatory Visit (HOSPITAL_COMMUNITY)
Admission: RE | Admit: 2020-08-19 | Discharge: 2020-08-19 | Disposition: A | Discharge status: Home / Self Care | Payer: 59 | Source: Ambulatory Visit | Attending: Obstetrics and Gynecology | Admitting: Obstetrics and Gynecology

## 2020-08-19 VITALS — BP 102/74 | HR 59 | Ht 69.0 in | Wt 129.7 lb

## 2020-08-19 DIAGNOSIS — Z124 Encounter for screening for malignant neoplasm of cervix: Secondary | ICD-10-CM

## 2020-08-19 DIAGNOSIS — Z1231 Encounter for screening mammogram for malignant neoplasm of breast: Secondary | ICD-10-CM

## 2020-08-19 DIAGNOSIS — N951 Menopausal and female climacteric states: Secondary | ICD-10-CM

## 2020-08-19 DIAGNOSIS — Z01419 Encounter for gynecological examination (general) (routine) without abnormal findings: Secondary | ICD-10-CM | POA: Diagnosis not present

## 2020-08-19 DIAGNOSIS — E785 Hyperlipidemia, unspecified: Secondary | ICD-10-CM | POA: Diagnosis not present

## 2020-08-19 DIAGNOSIS — Z131 Encounter for screening for diabetes mellitus: Secondary | ICD-10-CM

## 2020-08-19 NOTE — Progress Notes (Signed)
Pt present for annual exam. Pt stated that she was doing well no problems.  

## 2020-08-19 NOTE — Progress Notes (Signed)
GYNECOLOGY ANNUAL PHYSICAL EXAM PROGRESS NOTE  Subjective:    Hannah Chavez is a 59 y.o. G2P2 female who presents for an annual exam. The patient has no complaints today. She is transitioning care from [facility], [place]. The patient is sexually active. The patient wears seatbelts: yes. The patient participates in regular exercise: yes.    Gynecologic History  Menarche age:  No LMP recorded. Patient is postmenopausal. Contraception: post menopausal status History of STI's: Denies Last Pap: 12/2015. Results were: normal.  Has a remote h/o abnormal pap smear. Last mammogram: 10/2019. Results were: normal   OB History  Gravida Para Term Preterm AB Living  2 2 0 0 0 0  SAB TAB Ectopic Multiple Live Births  0 0 0 0 0    # Outcome Date GA Lbr Len/2nd Weight Sex Delivery Anes PTL Lv  2 Para 2000    F Vag-Spont     1 Para 1994    F Vag-Spont       Past Medical History:  Diagnosis Date  . Orthodontics    Permanant retainer - top front  . Vaginal Pap smear, abnormal   . Wears contact lenses     Past Surgical History:  Procedure Laterality Date  . COLONOSCOPY WITH PROPOFOL N/A 08/22/2015   Procedure: COLONOSCOPY WITH PROPOFOL;  Surgeon: Lucilla Lame, MD;  Location: [facility];  Service: Endoscopy;  Laterality: N/A;  PLEASE LEAVE PT EARLY AM PER DEE    Family History  Problem Relation Age of Onset  . Breast cancer Maternal Grandmother 79  . Scoliosis Mother   . Cardiomyopathy Father   . COPD Father   . Emphysema Father     Social History   Socioeconomic History  . Marital status: Married    Spouse name: Not on file  . Number of children: Not on file  . Years of education: Not on file  . Highest education level: Not on file  Occupational History  . Not on file  Tobacco Use  . Smoking status: Former Smoker    Quit date: 08/30/1989    Years since quitting: 30.9  . Smokeless tobacco: Never Used  Vaping Use  . Vaping Use: Never used  Substance  and Sexual Activity  . Alcohol use: Yes    Comment: occas  . Drug use: No  . Sexual activity: Yes    Comment: partner-vasectomy  Other Topics Concern  . Not on file  Social History Narrative   ** Merged History Encounter **       Social Determinants of Health   Financial Resource Strain:   . Difficulty of Paying Living Expenses: Not on file  Food Insecurity:   . Worried About Charity fundraiser in the Last Year: Not on file  . Ran Out of Food in the Last Year: Not on file  Transportation Needs:   . Lack of Transportation (Medical): Not on file  . Lack of Transportation (Non-Medical): Not on file  Physical Activity:   . Days of Exercise per Week: Not on file  . Minutes of Exercise per Session: Not on file  Stress:   . Feeling of Stress : Not on file  Social Connections:   . Frequency of Communication with Friends and Family: Not on file  . Frequency of Social Gatherings with Friends and Family: Not on file  . Attends Religious Services: Not on file  . Active Member of Clubs or Organizations: Not on file  . Attends Club  or Organization Meetings: Not on file  . Marital Status: Not on file  Intimate Partner Violence:   . Fear of Current or Ex-Partner: Not on file  . Emotionally Abused: Not on file  . Physically Abused: Not on file  . Sexually Abused: Not on file    Current Outpatient Medications on File Prior to Visit  Medication Sig Dispense Refill  . Biotin 10 MG TABS Take by mouth.    . Calcium Citrate-Vitamin D (CALCIUM + D PO) Take by mouth.    . Multiple Vitamin (MULTI-VITAMIN DAILY PO) Take by mouth.    . Omega-3 Fatty Acids (FISH OIL PO) Take by mouth.    . ergocalciferol (VITAMIN D2) 50000 units capsule Take 50,000 Units by mouth once a week. (Patient not taking: Reported on 08/19/2020)     No current facility-administered medications on file prior to visit.    No Known Allergies   Review of Systems Constitutional: negative for chills, fatigue, fevers.  Reports occasional hot flushes at night, mild, manageable.  Eyes: negative for irritation, redness and visual disturbance Ears, nose, mouth, throat, and face: negative for hearing loss, nasal congestion, snoring and tinnitus Respiratory: negative for asthma, cough, sputum Cardiovascular: negative for chest pain, dyspnea, exertional chest pressure/discomfort, irregular heart beat, palpitations and syncope Gastrointestinal: negative for abdominal pain, change in bowel habits, nausea and vomiting Genitourinary: negative for abnormal menstrual periods, genital lesions, sexual problems and vaginal discharge, dysuria and urinary incontinence Integument/breast: negative for breast lump, breast tenderness and nipple discharge Hematologic/lymphatic: negative for bleeding and easy bruising Musculoskeletal:negative for back pain and muscle weakness Neurological: negative for dizziness, headaches, vertigo and weakness Endocrine: negative for diabetic symptoms including polydipsia, polyuria and skin dryness Allergic/Immunologic: negative for hay fever and urticaria     Objective:  Height 5\' 9"  (1.753 m), weight 129 lb 11.2 oz (58.8 kg). Body mass index is 19.15 kg/m.  General Appearance:    Alert, cooperative, no distress, appears stated age  Head:    Normocephalic, without obvious abnormality, atraumatic  Eyes:    PERRL, conjunctiva/corneas clear, EOM's intact, both eyes  Ears:    Normal external ear canals, both ears  Nose:   Nares normal, septum midline, mucosa normal, no drainage or sinus tenderness  Throat:   Lips, mucosa, and tongue normal; teeth and gums normal  Neck:   Supple, symmetrical, trachea midline, no adenopathy; thyroid: no enlargement/tenderness/nodules; no carotid bruit or JVD  Back:     Symmetric, no curvature, ROM normal, no CVA tenderness  Lungs:     Clear to auscultation bilaterally, respirations unlabored  Chest Wall:    No tenderness or deformity   Heart:    Regular rate and  rhythm, S1 and S2 normal, no murmur, rub or gallop  Breast Exam:    No tenderness, masses, or nipple abnormality  Abdomen:     Soft, non-tender, bowel sounds active all four quadrants, no masses, no organomegaly.    Genitalia:    Pelvic:external genitalia normal, vagina without lesions, discharge, or tenderness, rectovaginal septum  normal. Cervix normal in appearance, no cervical motion tenderness, no adnexal masses or tenderness.  Uterus normal size, shape, mobile, regular contours, nontender.  Rectal:    Normal external sphincter.  No hemorrhoids appreciated. Internal exam not done.   Extremities:   Extremities normal, atraumatic, no cyanosis or edema  Pulses:   2+ and symmetric all extremities  Skin:   Skin color, texture, turgor normal, no rashes or lesions  Lymph nodes:   Cervical,  supraclavicular, and axillary nodes normal  Neurologic:   CNII-XII intact, normal strength, sensation and reflexes throughout   .  Labs:  No results found for: WBC, HGB, HCT, MCV, PLT  Lab Results  Component Value Date   CREATININE 0.71 05/16/2019   BUN 10 05/16/2019   NA 140 05/16/2019   K 4.1 05/16/2019   CL 100 05/16/2019   CO2 26 05/16/2019    Lab Results  Component Value Date   ALT 17 05/16/2019   AST 33 05/16/2019   ALKPHOS 46 05/16/2019   BILITOT 0.4 05/16/2019    Lab Results  Component Value Date   CHOL 234 (H) 05/16/2019   HDL 85 05/16/2019   LDLCALC 135 (H) 05/16/2019   TRIG 70 05/16/2019   CHOLHDL 2.8 05/16/2019    Lab Results  Component Value Date   HGBA1C 5.3 05/16/2019    No results found for: TSH   Assessment:   1. Encounter for well woman exam with routine gynecological exam   2. Breast cancer screening by mammogram   3. Screening for diabetes mellitus   4. Dyslipidemia   5. Cervical cancer screening   6. Menopausal state    Plan:    Blood tests: CBC with diff, Comprehensive metabolic panel, Lipoproteins, TSH and A1c. Breast self exam technique reviewed  and patient encouraged to perform self-exam monthly. Contraception: post menopausal status. Discussed healthy lifestyle modifications. Mammogram up to date. Due in November. Order placed. Pap smear up performed today. Menopausal. Has occasional mild vasomotor symptoms, manageable.  Encouraged to take calcium and Vitamin D supplementation for prevention of osteoporosis. COVID vaccination status: completed vaccination series.  Follow up in 1 year for annual exam.   Rubie Maid, MD [facility]

## 2020-08-19 NOTE — Patient Instructions (Addendum)
Preventive Care 40-58 Years Old, Female Preventive care refers to visits with your health care provider and lifestyle choices that can promote health and wellness. This includes:  A yearly physical exam. This may also be called an annual well check.  Regular dental visits and eye exams.  Immunizations.  Screening for certain conditions.  Healthy lifestyle choices, such as eating a healthy diet, getting regular exercise, not using drugs or products that contain nicotine and tobacco, and limiting alcohol use. What can I expect for my preventive care visit? Physical exam Your health care provider will check your:  Height and weight. This may be used to calculate body mass index (BMI), which tells if you are at a healthy weight.  Heart rate and blood pressure.  Skin for abnormal spots. Counseling Your health care provider may ask you questions about your:  Alcohol, tobacco, and drug use.  Emotional well-being.  Home and relationship well-being.  Sexual activity.  Eating habits.  Work and work environment.  Method of birth control.  Menstrual cycle.  Pregnancy history. What immunizations do I need?  Influenza (flu) vaccine  This is recommended every year. Tetanus, diphtheria, and pertussis (Tdap) vaccine  You may need a Td booster every 10 years. Varicella (chickenpox) vaccine  You may need this if you have not been vaccinated. Zoster (shingles) vaccine  You may need this after age 60. Measles, mumps, and rubella (MMR) vaccine  You may need at least one dose of MMR if you were born in 1957 or later. You may also need a second dose. Pneumococcal conjugate (PCV13) vaccine  You may need this if you have certain conditions and were not previously vaccinated. Pneumococcal polysaccharide (PPSV23) vaccine  You may need one or two doses if you smoke cigarettes or if you have certain conditions. Meningococcal conjugate (MenACWY) vaccine  You may need this if you  have certain conditions. Hepatitis A vaccine  You may need this if you have certain conditions or if you travel or work in places where you may be exposed to hepatitis A. Hepatitis B vaccine  You may need this if you have certain conditions or if you travel or work in places where you may be exposed to hepatitis B. Haemophilus influenzae type b (Hib) vaccine  You may need this if you have certain conditions. Human papillomavirus (HPV) vaccine  If recommended by your health care provider, you may need three doses over 6 months. You may receive vaccines as individual doses or as more than one vaccine together in one shot (combination vaccines). Talk with your health care provider about the risks and benefits of combination vaccines. What tests do I need? Blood tests  Lipid and cholesterol levels. These may be checked every 5 years, or more frequently if you are over 50 years old.  Hepatitis C test.  Hepatitis B test. Screening  Lung cancer screening. You may have this screening every year starting at age 55 if you have a 30-pack-year history of smoking and currently smoke or have quit within the past 15 years.  Colorectal cancer screening. All adults should have this screening starting at age 50 and continuing until age 75. Your health care provider may recommend screening at age 45 if you are at increased risk. You will have tests every 1-10 years, depending on your results and the type of screening test.  Diabetes screening. This is done by checking your blood sugar (glucose) after you have not eaten for a while (fasting). You may have this   done every 1-3 years.  Mammogram. This may be done every 1-2 years. Talk with your health care provider about when you should start having regular mammograms. This may depend on whether you have a family history of breast cancer.  BRCA-related cancer screening. This may be done if you have a family history of breast, ovarian, tubal, or peritoneal  cancers.  Pelvic exam and Pap test. This may be done every 3 years starting at age 20. Starting at age 33, this may be done every 5 years if you have a Pap test in combination with an HPV test. Other tests  Sexually transmitted disease (STD) testing.  Bone density scan. This is done to screen for osteoporosis. You may have this scan if you are at high risk for osteoporosis. Follow these instructions at home: Eating and drinking  Eat a diet that includes fresh fruits and vegetables, whole grains, lean protein, and low-fat dairy.  Take vitamin and mineral supplements as recommended by your health care provider.  Do not drink alcohol if: ? Your health care provider tells you not to drink. ? You are pregnant, may be pregnant, or are planning to become pregnant.  If you drink alcohol: ? Limit how much you have to 0-1 drink a day. ? Be aware of how much alcohol is in your drink. In the [place] one drink equals one 12 oz bottle of beer (355 mL), one 5 oz glass of wine (148 mL), or one 1 oz glass of hard liquor (44 mL). Lifestyle  Take daily care of your teeth and gums.  Stay active. Exercise for at least 30 minutes on 5 or more days each week.  Do not use any products that contain nicotine or tobacco, such as cigarettes, e-cigarettes, and chewing tobacco. If you need help quitting, ask your health care provider.  If you are sexually active, practice safe sex. Use a condom or other form of birth control (contraception) in order to prevent pregnancy and STIs (sexually transmitted infections).  If told by your health care provider, take low-dose aspirin daily starting at age 38. What's next?  Visit your health care provider once a year for a well check visit.  Ask your health care provider how often you should have your eyes and teeth checked.  Stay up to date on all vaccines. This information is not intended to replace advice given to you by your health care provider. Make sure you  discuss any questions you have with your health care provider. Document Revised: 08/10/2018 Document Reviewed: 08/10/2018 Elsevier Patient Education  2020 [place] Breast self-awareness is knowing how your breasts look and feel. Doing breast self-awareness is important. It allows you to catch a breast problem early while it is still small and can be treated. All women should do breast self-awareness, including women who have had breast implants. Tell your doctor if you notice a change in your breasts. What you need:  A mirror.  A well-lit room. How to do a breast self-exam A breast self-exam is one way to learn what is normal for your breasts and to check for changes. To do a breast self-exam: Look for changes  1. Take off all the clothes above your waist. 2. Stand in front of a mirror in a room with good lighting. 3. Put your hands on your hips. 4. Push your hands down. 5. Look at your breasts and nipples in the mirror to see if one breast or nipple looks  different from the other. Check to see if: ? The shape of one breast is different. ? The size of one breast is different. ? There are wrinkles, dips, and bumps in one breast and not the other. 6. Look at each breast for changes in the skin, such as: ? Redness. ? Scaly areas. 7. Look for changes in your nipples, such as: ? Liquid around the nipples. ? Bleeding. ? Dimpling. ? Redness. ? A change in where the nipples are. Feel for changes  1. Lie on your back on the floor. 2. Feel each breast. To do this, follow these steps: ? Pick a breast to feel. ? Put the arm closest to that breast above your head. ? Use your other arm to feel the nipple area of your breast. Feel the area with the pads of your three middle fingers by making small circles with your fingers. For the first circle, press lightly. For the second circle, press harder. For the third circle, press even harder. ? Keep making circles  with your fingers at the different pressures as you move down your breast. Stop when you feel your ribs. ? Move your fingers a little toward the center of your body. ? Start making circles with your fingers again, this time going up until you reach your collarbone. ? Keep making up-and-down circles until you reach your armpit. Remember to keep using the three pressures. ? Feel the other breast in the same way. 3. Sit or stand in the tub or shower. 4. With soapy water on your skin, feel each breast the same way you did in step 2 when you were lying on the floor. Write down what you find Writing down what you find can help you remember what to tell your doctor. Write down:  What is normal for each breast.  Any changes you find in each breast, including: ? The kind of changes you find. ? Whether you have pain. ? Size and location of any lumps.  When you last had your menstrual period. General tips  Check your breasts every month.  If you are breastfeeding, the best time to check your breasts is after you feed your baby or after you use a breast pump.  If you get menstrual periods, the best time to check your breasts is 5-7 days after your menstrual period is over.  With time, you will become comfortable with the self-exam, and you will begin to know if there are changes in your breasts. Contact a doctor if you:  See a change in the shape or size of your breasts or nipples.  See a change in the skin of your breast or nipples, such as red or scaly skin.  Have fluid coming from your nipples that is not normal.  Find a lump or thick area that was not there before.  Have pain in your breasts.  Have any concerns about your breast health. Summary  Breast self-awareness includes looking for changes in your breasts, as well as feeling for changes within your breasts.  Breast self-awareness should be done in front of a mirror in a well-lit room.  You should check your breasts every  month. If you get menstrual periods, the best time to check your breasts is 5-7 days after your menstrual period is over.  Let your doctor know of any changes you see in your breasts, including changes in size, changes on the skin, pain or tenderness, or fluid from your nipples that is not normal. This  intended to replace advice given to you by your health care provider. Make sure you discuss any questions you have with your health care provider. Document Revised: 07/18/2018 Document Reviewed: 07/18/2018 Elsevier Patient Education  2020 Elsevier Inc[employer]  

## 2020-08-20 LAB — COMPREHENSIVE METABOLIC PANEL
ALT: 23 IU/L (ref 0–32)
AST: 33 IU/L (ref 0–40)
Albumin/Globulin Ratio: 2 (ref 1.2–2.2)
Albumin: 5.1 g/dL — ABNORMAL HIGH (ref 3.8–4.9)
Alkaline Phosphatase: 54 IU/L (ref 48–121)
BUN/Creatinine Ratio: 14 (ref 9–23)
BUN: 11 mg/dL (ref 6–24)
Bilirubin Total: 0.6 mg/dL (ref 0.0–1.2)
CO2: 27 mmol/L (ref 20–29)
Calcium: 9.9 mg/dL (ref 8.7–10.2)
Chloride: 99 mmol/L (ref 96–106)
Creatinine, Ser: 0.76 mg/dL (ref 0.57–1.00)
GFR calc Af Amer: 101 mL/min/{1.73_m2} (ref >59)
GFR calc non Af Amer: 87 mL/min/{1.73_m2} (ref >59)
Globulin, Total: 2.5 g/dL (ref 1.5–4.5)
Glucose: 90 mg/dL (ref 65–99)
Potassium: 4.7 mmol/L (ref 3.5–5.2)
Sodium: 141 mmol/L (ref 134–144)
Total Protein: 7.6 g/dL (ref 6.0–8.5)

## 2020-08-20 LAB — HEMOGLOBIN A1C
Est. average glucose Bld gHb Est-mCnc: 111 mg/dL
Hgb A1c MFr Bld: 5.5 % (ref 4.8–5.6)

## 2020-08-20 LAB — LIPID PANEL
Chol/HDL Ratio: 3.1 ratio (ref 0.0–4.4)
Cholesterol, Total: 288 mg/dL — ABNORMAL HIGH (ref 100–199)
HDL: 94 mg/dL (ref >39)
LDL Chol Calc (NIH): 168 mg/dL — ABNORMAL HIGH (ref 0–99)
Triglycerides: 147 mg/dL (ref 0–149)
VLDL Cholesterol Cal: 26 mg/dL (ref 5–40)

## 2020-08-20 LAB — CBC
Hematocrit: 42.1 % (ref 34.0–46.6)
Hemoglobin: 14.3 g/dL (ref 11.1–15.9)
MCH: 31.4 pg (ref 26.6–33.0)
MCHC: 34 g/dL (ref 31.5–35.7)
MCV: 93 fL (ref 79–97)
Platelets: 226 10*3/uL (ref 150–450)
RBC: 4.55 x10E6/uL (ref 3.77–5.28)
RDW: 12.4 % (ref 11.7–15.4)
WBC: 6.2 10*3/uL (ref 3.4–10.8)

## 2020-08-20 LAB — TSH: TSH: 2.69 u[IU]/mL (ref 0.450–4.500)

## 2020-08-22 LAB — CYTOLOGY - PAP
Comment: NEGATIVE
Diagnosis: NEGATIVE
High risk HPV: NEGATIVE

## 2020-12-02 ENCOUNTER — Ambulatory Visit
Admission: RE | Admit: 2020-12-02 | Discharge: 2020-12-02 | Disposition: A | Discharge status: Home / Self Care | Payer: 59 | Source: Ambulatory Visit | Attending: Obstetrics and Gynecology | Admitting: Obstetrics and Gynecology

## 2020-12-02 ENCOUNTER — Other Ambulatory Visit: Payer: Self-pay

## 2020-12-02 DIAGNOSIS — Z1231 Encounter for screening mammogram for malignant neoplasm of breast: Secondary | ICD-10-CM | POA: Insufficient documentation

## 2021-01-19 DIAGNOSIS — L578 Other skin changes due to chronic exposure to nonionizing radiation: Secondary | ICD-10-CM | POA: Diagnosis not present

## 2021-01-19 DIAGNOSIS — D485 Neoplasm of uncertain behavior of skin: Secondary | ICD-10-CM | POA: Diagnosis not present

## 2021-01-19 DIAGNOSIS — D225 Melanocytic nevi of trunk: Secondary | ICD-10-CM | POA: Diagnosis not present

## 2021-05-04 DIAGNOSIS — H5203 Hypermetropia, bilateral: Secondary | ICD-10-CM | POA: Diagnosis not present

## 2021-08-21 ENCOUNTER — Encounter: Payer: 59 | Admitting: Obstetrics and Gynecology

## 2021-10-13 NOTE — Progress Notes (Signed)
ANNUAL PREVENTATIVE CARE GYNECOLOGY  ENCOUNTER NOTE  Subjective:       Hannah Chavez is a 60 y.o. G2P2 female here for a routine annual gynecologic exam. The patient is sexually active. The patient is not taking hormone replacement therapy. Patient denies post-menopausal vaginal bleeding. The patient wears seatbelts: yes. The patient participates in regular exercise: yes. Has the patient ever been transfused or tattooed?: no. The patient reports that there is not domestic violence in her life.  No current complaints.  Reports that she has made dietary changes since being diagnosed with elevated lipids last year.  Engaging in low-cholesterol/low-fat diet and taking Metamucil. Is maintaining a healthy lifestyle.    Gynecologic History No LMP recorded. Patient is postmenopausal. Contraception: post menopausal status Last Pap: 08/19/2020. Results were: normal Last mammogram: 12/02/2020. Results were: normal Last Colonoscopy: 9/9//2016.  Results were normal.  Last Dexa Scan: 10/11/2018.  Osteopenia (T score -1.7).    Obstetric History OB History  Gravida Para Term Preterm AB Living  2 2          SAB IAB Ectopic Multiple Live Births               # Outcome Date GA Lbr Len/2nd Weight Sex Delivery Anes PTL Lv  2 Para 2000    F Vag-Spont     1 Para 1994    F Vag-Spont       Past Medical History:  Diagnosis Date   Orthodontics    Permanant retainer - top front   Vaginal Pap smear, abnormal    Wears contact lenses     Family History  Problem Relation Age of Onset   Breast cancer Maternal Grandmother 60   Scoliosis Mother    Cardiomyopathy Father    COPD Father    Emphysema Father     Past Surgical History:  Procedure Laterality Date   COLONOSCOPY WITH PROPOFOL N/A 08/22/2015   Procedure: COLONOSCOPY WITH PROPOFOL;  Surgeon: Lucilla Lame, MD;  Location: [facility];  Service: Endoscopy;  Laterality: N/A;  PLEASE LEAVE PT EARLY AM PER DEE    Social History    Socioeconomic History   Marital status: Married    Spouse name: Not on file   Number of children: Not on file   Years of education: Not on file   Highest education level: Not on file  Occupational History   Not on file  Tobacco Use   Smoking status: Former    Types: Cigarettes    Quit date: 08/30/1989    Years since quitting: 32.1   Smokeless tobacco: Never  Vaping Use   Vaping Use: Never used  Substance and Sexual Activity   Alcohol use: Yes    Comment: occas   Drug use: No   Sexual activity: Yes    Comment: partner-vasectomy  Other Topics Concern   Not on file  Social History Narrative   ** Merged History Encounter **       Social Determinants of Health   Financial Resource Strain: Not on file  Food Insecurity: Not on file  Transportation Needs: Not on file  Physical Activity: Not on file  Stress: Not on file  Social Connections: Not on file  Intimate Partner Violence: Not on file    Current Outpatient Medications on File Prior to Visit  Medication Sig Dispense Refill   Biotin 10 MG TABS Take by mouth.     Calcium Citrate-Vitamin D (CALCIUM + D PO) Take by mouth.  ergocalciferol (VITAMIN D2) 50000 units capsule Take 50,000 Units by mouth once a week. (Patient not taking: Reported on 08/19/2020)     Multiple Vitamin (MULTI-VITAMIN DAILY PO) Take by mouth.     Omega-3 Fatty Acids (FISH OIL PO) Take by mouth.     No current facility-administered medications on file prior to visit.    No Known Allergies    Review of Systems ROS Review of Systems - General ROS: negative for - chills, fatigue, fever, hot flashes, night sweats, weight gain or weight loss Psychological ROS: negative for - anxiety, decreased libido, depression, mood swings, physical abuse or sexual abuse Ophthalmic ROS: negative for - blurry vision, eye pain or loss of vision ENT ROS: negative for - headaches, hearing change, visual changes or vocal changes Allergy and Immunology ROS: negative  for - hives, itchy/watery eyes or seasonal allergies Hematological and Lymphatic ROS: negative for - bleeding problems, bruising, swollen lymph nodes or weight loss Endocrine ROS: negative for - galactorrhea, hair pattern changes, hot flashes, malaise/lethargy, mood swings, palpitations, polydipsia/polyuria, skin changes, temperature intolerance or unexpected weight changes Breast ROS: negative for - new or changing breast lumps or nipple discharge Respiratory ROS: negative for - cough or shortness of breath Cardiovascular ROS: negative for - chest pain, irregular heartbeat, palpitations or shortness of breath Gastrointestinal ROS: no abdominal pain, change in bowel habits, or black or bloody stools Genito-Urinary ROS: no dysuria, trouble voiding, or hematuria Musculoskeletal ROS: negative for - joint pain or joint stiffness Neurological ROS: negative for - bowel and bladder control changes Dermatological ROS: negative for rash and skin lesion changes   Objective:   BP 119/76   Pulse 63   Resp 16   Ht 5\' 9"  (1.753 m)   Wt 126 lb 11.2 oz (57.5 kg)   BMI 18.71 kg/m  CONSTITUTIONAL: Well-developed, well-nourished female in no acute distress.  PSYCHIATRIC: Normal mood and affect. Normal behavior. Normal judgment and thought content. [place]: Alert and oriented to person, place, and time. Normal muscle tone coordination. No cranial nerve deficit noted. HENT:  Normocephalic, atraumatic, External right and left ear normal. Oropharynx is clear and moist EYES: Conjunctivae and EOM are normal. Pupils are equal, round, and reactive to light. No scleral icterus.  NECK: Normal range of motion, supple, no masses.  Normal thyroid.  SKIN: Skin is warm and dry. No rash noted. Not diaphoretic. No erythema. No pallor. CARDIOVASCULAR: Normal heart rate noted, regular rhythm, no murmur. RESPIRATORY: Clear to auscultation bilaterally. Effort and breath sounds normal, no problems with respiration  noted. BREASTS: Symmetric in size. No masses, skin changes, nipple drainage, or lymphadenopathy. ABDOMEN: Soft, normal bowel sounds, no distention noted.  No tenderness, rebound or guarding.  BLADDER: Normal PELVIC:  Bladder no bladder distension noted  Urethra: mildly atrophic appearing urethra with no masses, tenderness or lesions  Vulva: normal appearing vulva with no masses, tenderness or lesions  Vagina: moderately atrophic appearing vagina with no discharge, no lesions  Cervix: normal appearing cervix without discharge or lesions  Uterus: uterus is normal size, shape, consistency and nontender  Adnexa: normal adnexa in size, nontender and no masses  RV: External Exam NormaI, No Rectal Masses, and Normal Sphincter tone  MUSCULOSKELETAL: Normal range of motion. No tenderness.  No cyanosis, clubbing, or edema.  2+ distal pulses. LYMPHATIC: No Axillary, Supraclavicular, or Inguinal Adenopathy.   Labs: Lab Results  Component Value Date   WBC 6.2 08/19/2020   HGB 14.3 08/19/2020   HCT 42.1 08/19/2020   MCV  93 08/19/2020   PLT 226 08/19/2020    Lab Results  Component Value Date   CREATININE 0.76 08/19/2020   BUN 11 08/19/2020   NA 141 08/19/2020   K 4.7 08/19/2020   CL 99 08/19/2020   CO2 27 08/19/2020    Lab Results  Component Value Date   ALT 23 08/19/2020   AST 33 08/19/2020   ALKPHOS 54 08/19/2020   BILITOT 0.6 08/19/2020    Lab Results  Component Value Date   CHOL 288 (H) 08/19/2020   HDL 94 08/19/2020   LDLCALC 168 (H) 08/19/2020   TRIG 147 08/19/2020   CHOLHDL 3.1 08/19/2020    Lab Results  Component Value Date   TSH 2.690 08/19/2020    Lab Results  Component Value Date   HGBA1C 5.5 08/19/2020     Assessment:   1. Well woman exam with routine gynecological exam   2. Dyslipidemia   3. Breast cancer screening by mammogram   4. Menopausal state    Plan:  - Pap:  up to date. - Mammogram:  Up to date. Due in 1 month. - Colon screening:   Up  to date. Next due in 2026. - Labs: Lipid 1 and FBS - Routine preventative health maintenance measures emphasized: Exercise/Diet/Weight control, Tobacco Warnings, Alcohol/Substance use risks, Stress Management, Peer Pressure Issues, and Safe Sex - COVID Vaccination status: has completed Pfizer vaccination series and 2 boosters.  - Flu vaccination up to date (received at job, [employer]). - Dyslipidemia diagnosed last year, patient notes she has made dietary changes. Will repeat today.  - Return to [place], MD [facility]

## 2021-10-14 ENCOUNTER — Other Ambulatory Visit: Payer: Self-pay

## 2021-10-14 ENCOUNTER — Encounter: Payer: Self-pay | Admitting: Obstetrics and Gynecology

## 2021-10-14 ENCOUNTER — Ambulatory Visit (INDEPENDENT_AMBULATORY_CARE_PROVIDER_SITE_OTHER): Payer: 59 | Admitting: Obstetrics and Gynecology

## 2021-10-14 VITALS — BP 119/76 | HR 63 | Resp 16 | Ht 69.0 in | Wt 126.7 lb

## 2021-10-14 DIAGNOSIS — E785 Hyperlipidemia, unspecified: Secondary | ICD-10-CM

## 2021-10-14 DIAGNOSIS — Z01419 Encounter for gynecological examination (general) (routine) without abnormal findings: Secondary | ICD-10-CM | POA: Diagnosis not present

## 2021-10-14 DIAGNOSIS — N951 Menopausal and female climacteric states: Secondary | ICD-10-CM

## 2021-10-14 DIAGNOSIS — Z1231 Encounter for screening mammogram for malignant neoplasm of breast: Secondary | ICD-10-CM

## 2021-10-14 NOTE — Patient Instructions (Addendum)
Preventive Care 40-59 Years Old, Female Preventive care refers to lifestyle choices and visits with your health care provider that can promote health and wellness. This includes: A yearly physical exam. This is also called an annual wellness visit. Regular dental and eye exams. Immunizations. Screening for certain conditions. Healthy lifestyle choices, such as: Eating a healthy diet. Getting regular exercise. Not using drugs or products that contain nicotine and tobacco. Limiting alcohol use. What can I expect for my preventive care visit? Physical exam Your health care provider will check your: Height and weight. These may be used to calculate your BMI (body mass index). BMI is a measurement that tells if you are at a healthy weight. Heart rate and blood pressure. Body temperature. Skin for abnormal spots. Counseling Your health care provider may ask you questions about your: Past medical problems. Family's medical history. Alcohol, tobacco, and drug use. Emotional well-being. Home life and relationship well-being. Sexual activity. Diet, exercise, and sleep habits. Work and work environment. Access to firearms. Method of birth control. Menstrual cycle. Pregnancy history. What immunizations do I need? Vaccines are usually given at various ages, according to a schedule. Your health care provider will recommend vaccines for you based on your age, medical history, and lifestyle or other factors, such as travel or where you work. What tests do I need? Blood tests Lipid and cholesterol levels. These may be checked every 5 years, or more often if you are over 50 years old. Hepatitis C test. Hepatitis B test. Screening Lung cancer screening. You may have this screening every year starting at age 55 if you have a 30-pack-year history of smoking and currently smoke or have quit within the past 15 years. Colorectal cancer screening. All adults should have this screening starting at  age 50 and continuing until age 75. Your health care provider may recommend screening at age 45 if you are at increased risk. You will have tests every 1-10 years, depending on your results and the type of screening test. Diabetes screening. This is done by checking your blood sugar (glucose) after you have not eaten for a while (fasting). You may have this done every 1-3 years. Mammogram. This may be done every 1-2 years. Talk with your health care provider about when you should start having regular mammograms. This may depend on whether you have a family history of breast cancer. BRCA-related cancer screening. This may be done if you have a family history of breast, ovarian, tubal, or peritoneal cancers. Pelvic exam and Pap test. This may be done every 3 years starting at age 21. Starting at age 30, this may be done every 5 years if you have a Pap test in combination with an HPV test. Other tests STD (sexually transmitted disease) testing, if you are at risk. Bone density scan. This is done to screen for osteoporosis. You may have this scan if you are at high risk for osteoporosis. Talk with your health care provider about your test results, treatment options, and if necessary, the need for more tests. Follow these instructions at home: Eating and drinking  Eat a diet that includes fresh fruits and vegetables, whole grains, lean protein, and low-fat dairy products. Take vitamin and mineral supplements as recommended by your health care provider. Do not drink alcohol if: Your health care provider tells you not to drink. You are pregnant, may be pregnant, or are planning to become pregnant. If you drink alcohol: Limit how much you have to 0-1 drink a day. Be   aware of how much alcohol is in your drink. In the [place] one drink equals one 12 oz bottle of beer (355 mL), one 5 oz glass of wine (148 mL), or one 1 oz glass of hard liquor (44 mL). Lifestyle Take daily care of your teeth and  gums. Brush your teeth every morning and night with fluoride toothpaste. Floss one time each day. Stay active. Exercise for at least 30 minutes 5 or more days each week. Do not use any products that contain nicotine or tobacco, such as cigarettes, e-cigarettes, and chewing tobacco. If you need help quitting, ask your health care provider. Do not use drugs. If you are sexually active, practice safe sex. Use a condom or other form of protection to prevent STIs (sexually transmitted infections). If you do not wish to become pregnant, use a form of birth control. If you plan to become pregnant, see your health care provider for a prepregnancy visit. If told by your health care provider, take low-dose aspirin daily starting at age 56. Find healthy ways to cope with stress, such as: Meditation, yoga, or listening to music. Journaling. Talking to a trusted person. Spending time with friends and family. Safety Always wear your seat belt while driving or riding in a vehicle. Do not drive: If you have been drinking alcohol. Do not ride with someone who has been drinking. When you are tired or distracted. While texting. Wear a helmet and other protective equipment during sports activities. If you have firearms in your house, make sure you follow all gun safety procedures. What's next? Visit your health care provider once a year for an annual wellness visit. Ask your health care provider how often you should have your eyes and teeth checked. Stay up to date on all vaccines. This information is not intended to replace advice given to you by your health care provider. Make sure you discuss any questions you have with your health care provider. Document Revised: 02/06/2021 Document Reviewed: 08/10/2018 Elsevier Patient Education  2022 [place] Breast self-awareness means being familiar with how your breasts look and feel. It involves checking your breasts regularly and  reporting any changes to your health care provider. Practicing breast self-awareness is important. Sometimes changes may not be harmful (are benign), but sometimes a change in your breasts can be a sign of a serious medical problem. It is important to learn how to do this procedure correctly so that you can catch problems early, when treatment is more likely to be successful. All women should practice breast self-awareness, including women who have had breast implants. What you need: A mirror. A well-lit room. How to do a breast self-exam A breast self-exam is one way to learn what is normal for your breasts and whether your breasts are changing. To do a breast self-exam: Look for changes  Remove all the clothing above your waist. Stand in front of a mirror in a room with good lighting. Put your hands on your hips. Push your hands firmly downward. Compare your breasts in the mirror. Look for differences between them (asymmetry), such as: Differences in shape. Differences in size. Puckers, dips, and bumps in one breast and not the other. Look at each breast for changes in the skin, such as: Redness. Scaly areas. Look for changes in your nipples, such as: Discharge. Bleeding. Dimpling. Redness. A change in position. Feel for changes Carefully feel your breasts for lumps and changes. It is best to do this while lying  on your back on the floor, and again while sitting or standing in the tub or shower with soapy water on your skin. Feel each breast in the following way: Place the arm on the side of the breast you are examining above your head. Feel your breast with the other hand. Start in the nipple area and make -inch (2 cm) overlapping circles to feel your breast. Use the pads of your three middle fingers to do this. Apply light pressure, then medium pressure, then firm pressure. The light pressure will allow you to feel the tissue closest to the skin. The medium pressure will allow you  to feel the tissue that is a little deeper. The firm pressure will allow you to feel the tissue close to the ribs. Continue the overlapping circles, moving downward over the breast until you feel your ribs below your breast. Move one finger-width toward the center of the body. Continue to use the -inch (2 cm) overlapping circles to feel your breast as you move slowly up toward your collarbone. Continue the up-and-down exam using all three pressures until you reach your armpit.  Write down what you find Writing down what you find can help you remember what to discuss with your health care provider. Write down: What is normal for each breast. Any changes that you find in each breast, including: The kind of changes you find. Any pain or tenderness. Size and location of any lumps. Where you are in your menstrual cycle, if you are still menstruating. General tips and recommendations Examine your breasts every month. If you are breastfeeding, the best time to examine your breasts is after a feeding or after using a breast pump. If you menstruate, the best time to examine your breasts is 5-7 days after your period. Breasts are generally lumpier during menstrual periods, and it may be more difficult to notice changes. With time and practice, you will become more familiar with the variations in your breasts and more comfortable with the exam. Contact a health care provider if you: See a change in the shape or size of your breasts or nipples. See a change in the skin of your breast or nipples, such as a reddened or scaly area. Have unusual discharge from your nipples. Find a lump or thick area that was not there before. Have pain in your breasts. Have any concerns related to your breast health. Summary Breast self-awareness includes looking for physical changes in your breasts, as well as feeling for any changes within your breasts. Breast self-awareness should be performed in front of a mirror in  a well-lit room. You should examine your breasts every month. If you menstruate, the best time to examine your breasts is 5-7 days after your menstrual period. Let your health care provider know of any changes you notice in your breasts, including changes in size, changes on the skin, pain or tenderness, or unusual fluid from your nipples. This information is not intended to replace advice given to you by your health care provider. Make sure you discuss any questions you have with your health care provider. Document Revised: 07/18/2018 Document Reviewed: 07/18/2018 Elsevier Patient Education  [place].

## 2021-10-15 LAB — CBC
Hematocrit: 42.4 % (ref 34.0–46.6)
Hemoglobin: 13.8 g/dL (ref 11.1–15.9)
MCH: 30 pg (ref 26.6–33.0)
MCHC: 32.5 g/dL (ref 31.5–35.7)
MCV: 92 fL (ref 79–97)
Platelets: 219 10*3/uL (ref 150–450)
RBC: 4.6 x10E6/uL (ref 3.77–5.28)
RDW: 12.4 % (ref 11.7–15.4)
WBC: 4.6 10*3/uL (ref 3.4–10.8)

## 2021-10-15 LAB — LIPID PANEL
Chol/HDL Ratio: 2.6 ratio (ref 0.0–4.4)
Cholesterol, Total: 216 mg/dL — ABNORMAL HIGH (ref 100–199)
HDL: 83 mg/dL (ref >39)
LDL Chol Calc (NIH): 117 mg/dL — ABNORMAL HIGH (ref 0–99)
Triglycerides: 92 mg/dL (ref 0–149)
VLDL Cholesterol Cal: 16 mg/dL (ref 5–40)

## 2021-10-15 LAB — COMPREHENSIVE METABOLIC PANEL
ALT: 15 IU/L (ref 0–32)
AST: 26 IU/L (ref 0–40)
Albumin/Globulin Ratio: 2.1 (ref 1.2–2.2)
Albumin: 5 g/dL — ABNORMAL HIGH (ref 3.8–4.9)
Alkaline Phosphatase: 55 IU/L (ref 44–121)
BUN/Creatinine Ratio: 13 (ref 9–23)
BUN: 10 mg/dL (ref 6–24)
Bilirubin Total: 0.3 mg/dL (ref 0.0–1.2)
CO2: 25 mmol/L (ref 20–29)
Calcium: 9.5 mg/dL (ref 8.7–10.2)
Chloride: 101 mmol/L (ref 96–106)
Creatinine, Ser: 0.78 mg/dL (ref 0.57–1.00)
Globulin, Total: 2.4 g/dL (ref 1.5–4.5)
Glucose: 78 mg/dL (ref 70–99)
Potassium: 4 mmol/L (ref 3.5–5.2)
Sodium: 140 mmol/L (ref 134–144)
Total Protein: 7.4 g/dL (ref 6.0–8.5)
eGFR: 87 mL/min/{1.73_m2} (ref >59)

## 2021-11-23 ENCOUNTER — Other Ambulatory Visit: Payer: Self-pay | Admitting: Obstetrics and Gynecology

## 2021-11-23 DIAGNOSIS — Z1231 Encounter for screening mammogram for malignant neoplasm of breast: Secondary | ICD-10-CM

## 2021-12-09 ENCOUNTER — Other Ambulatory Visit: Payer: Self-pay

## 2021-12-09 ENCOUNTER — Ambulatory Visit
Admission: RE | Admit: 2021-12-09 | Discharge: 2021-12-09 | Disposition: A | Discharge status: Home / Self Care | Payer: 59 | Source: Ambulatory Visit | Attending: Obstetrics and Gynecology | Admitting: Obstetrics and Gynecology

## 2021-12-09 DIAGNOSIS — Z1231 Encounter for screening mammogram for malignant neoplasm of breast: Secondary | ICD-10-CM | POA: Insufficient documentation

## 2022-01-19 ENCOUNTER — Other Ambulatory Visit: Payer: Self-pay

## 2022-01-19 DIAGNOSIS — L578 Other skin changes due to chronic exposure to nonionizing radiation: Secondary | ICD-10-CM | POA: Diagnosis not present

## 2022-01-19 DIAGNOSIS — L404 Guttate psoriasis: Secondary | ICD-10-CM | POA: Diagnosis not present

## 2022-01-19 DIAGNOSIS — Z86018 Personal history of other benign neoplasm: Secondary | ICD-10-CM | POA: Diagnosis not present

## 2022-01-19 MED ORDER — TRIAMCINOLONE ACETONIDE 0.1 % EX CREA
TOPICAL_CREAM | CUTANEOUS | 0 refills | Status: DC
  Filled 2022-01-19: qty 80, 30d supply, fill #0

## 2022-01-20 ENCOUNTER — Other Ambulatory Visit: Payer: Self-pay

## 2022-01-20 MED ORDER — CARESTART COVID-19 HOME TEST VI KIT
PACK | 0 refills | Status: DC
  Filled 2022-01-20: qty 2, 4d supply, fill #0

## 2022-02-16 ENCOUNTER — Other Ambulatory Visit: Payer: Self-pay

## 2022-10-19 ENCOUNTER — Other Ambulatory Visit: Payer: Self-pay

## 2022-10-19 MED ORDER — DIAZEPAM 5 MG PO TABS
ORAL_TABLET | ORAL | 0 refills | Status: DC
  Filled 2022-10-19: qty 2, 1d supply, fill #0

## 2022-11-22 DIAGNOSIS — H5203 Hypermetropia, bilateral: Secondary | ICD-10-CM | POA: Diagnosis not present

## 2022-12-02 IMAGING — MG DIGITAL SCREENING BILAT W/ TOMO W/ CAD
8 series · 9 of 24 positions shown · non-contrast
Comparison: Previous exam(s).

CLINICAL DATA: Screening.

EXAM:
DIGITAL SCREENING BILATERAL MAMMOGRAM WITH TOMO AND CAD

[R MLO synth-2D]
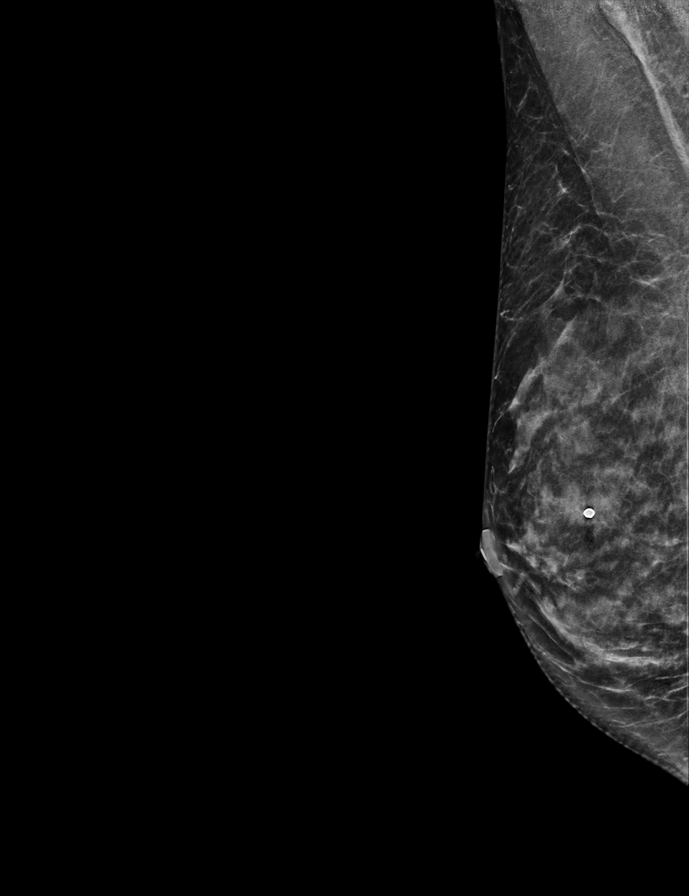

[L CC synth-2D]
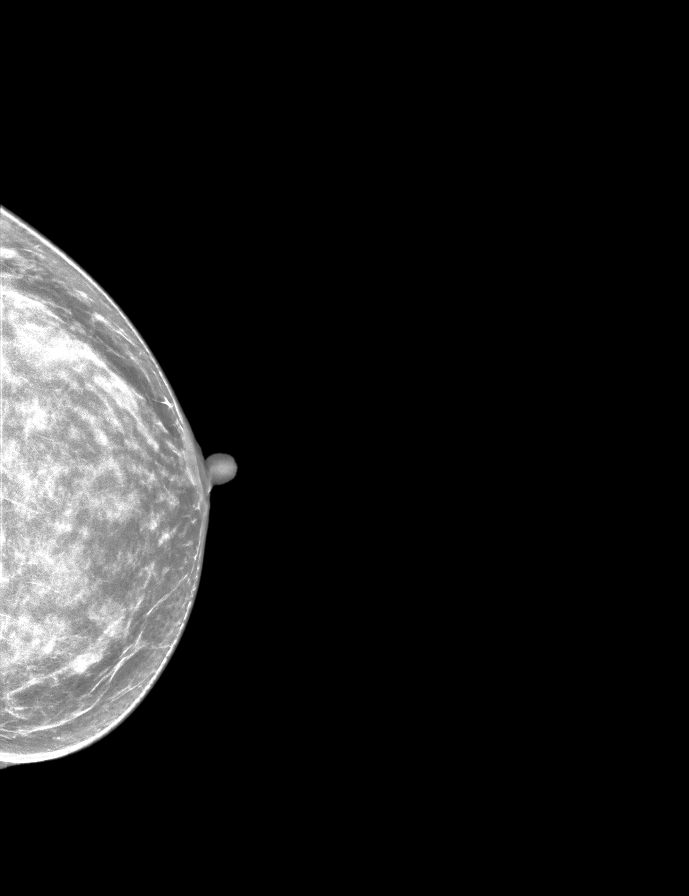

[R CC synth-2D]
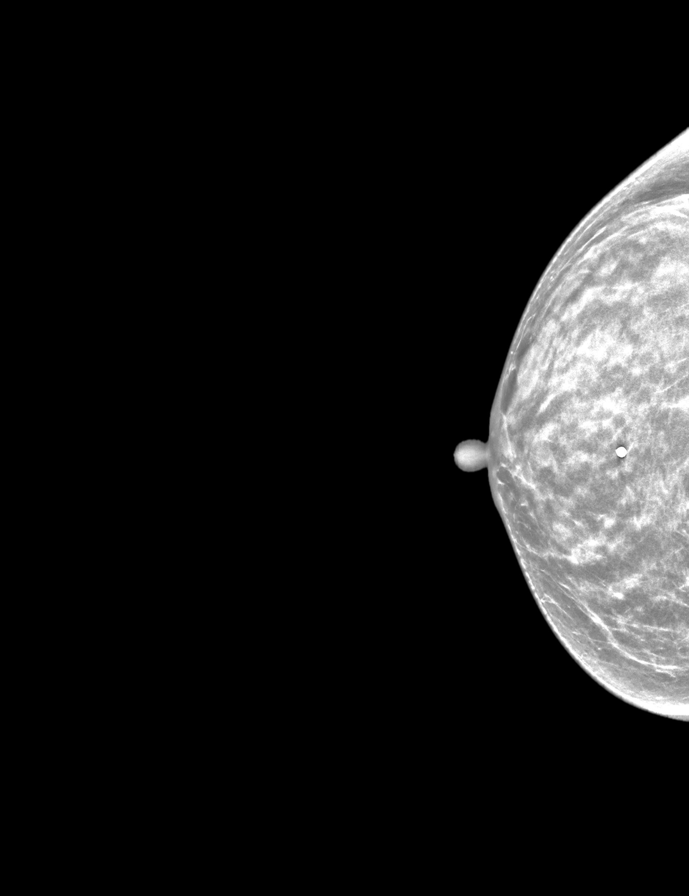

[L MLO synth-2D]
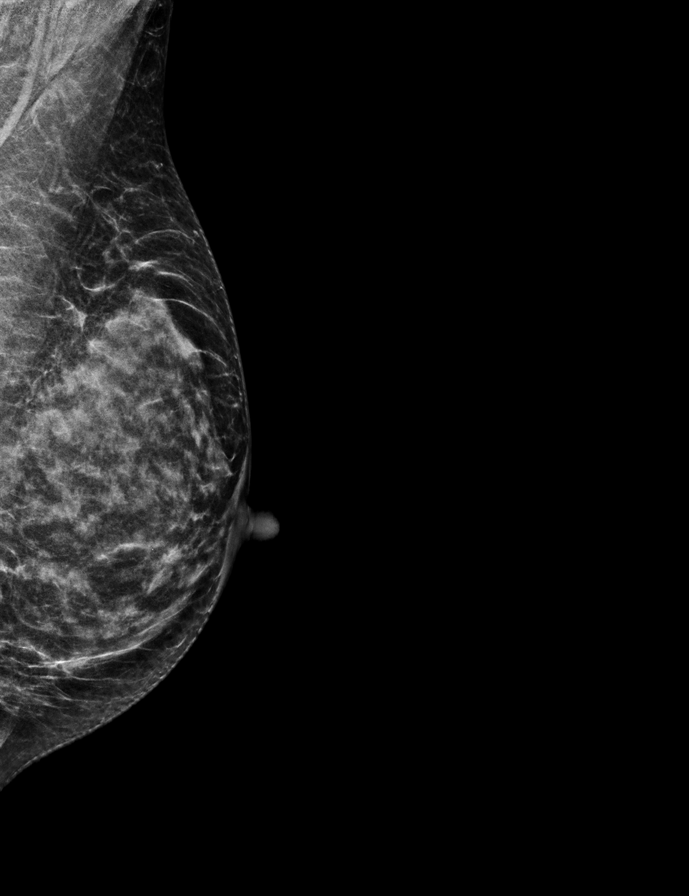

[L CC tomo · 2 of 54 frames shown]
[frame 18/54]
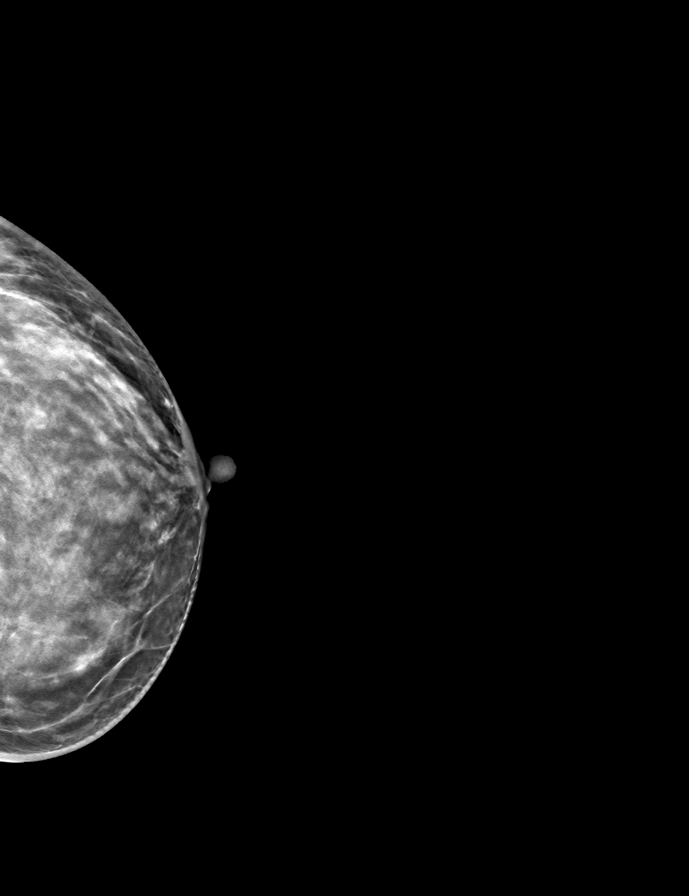
[frame 27/54]
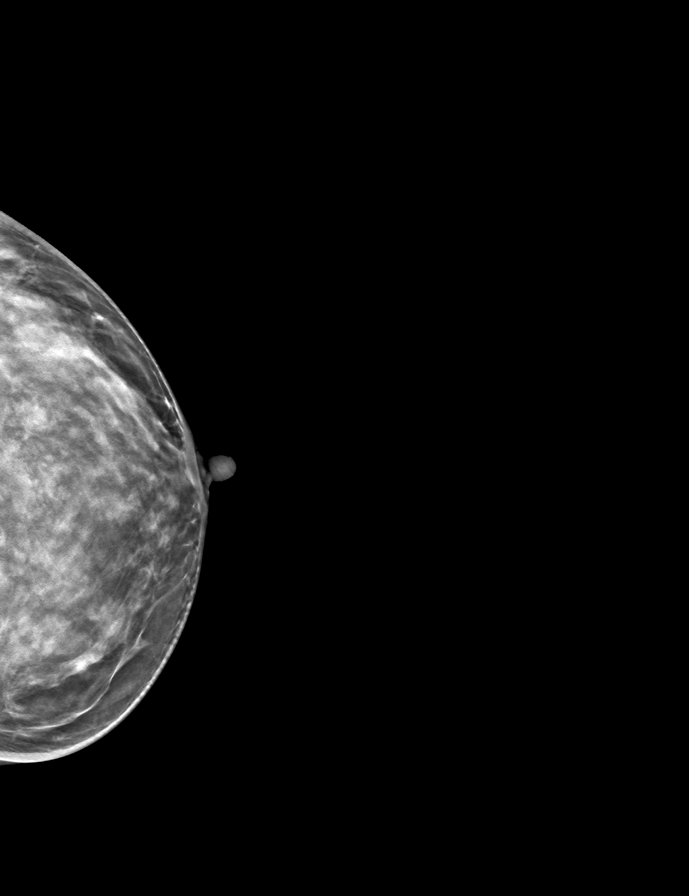

[R CC tomo · tomo slice 23/45.0]
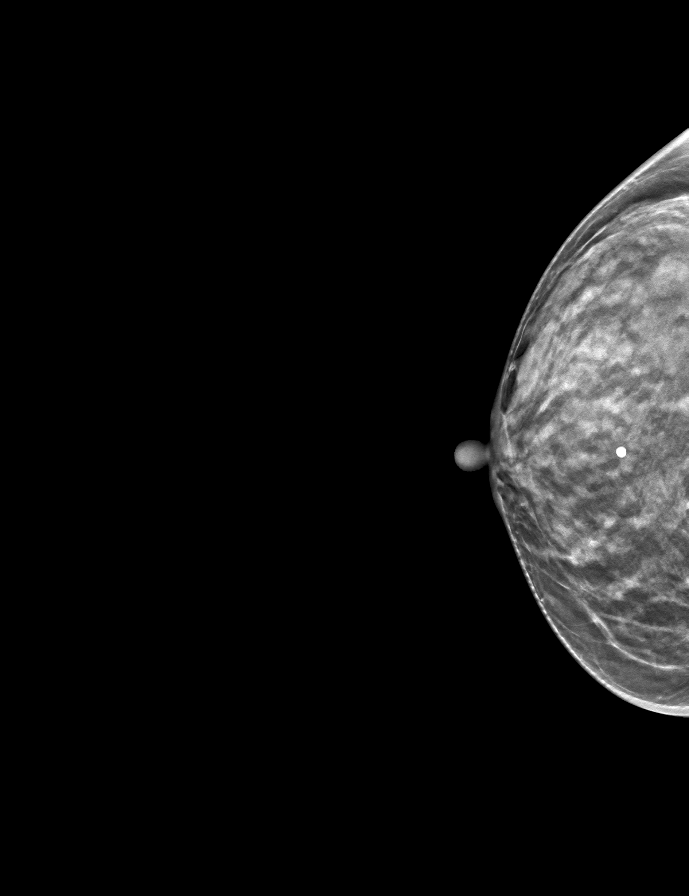

[L MLO tomo · tomo slice 22/43.0]
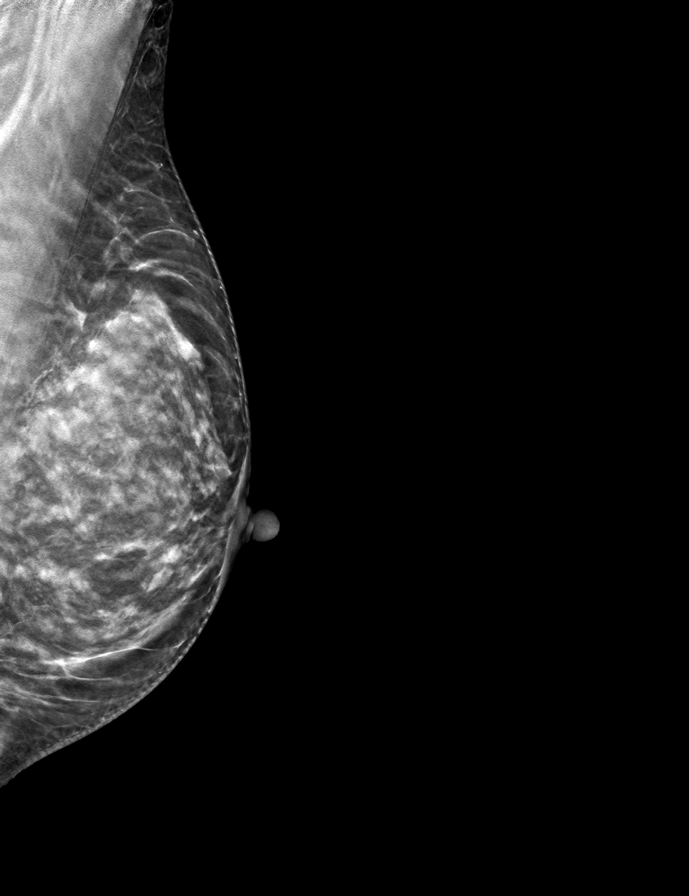

[R MLO tomo · tomo slice 29/58.0]
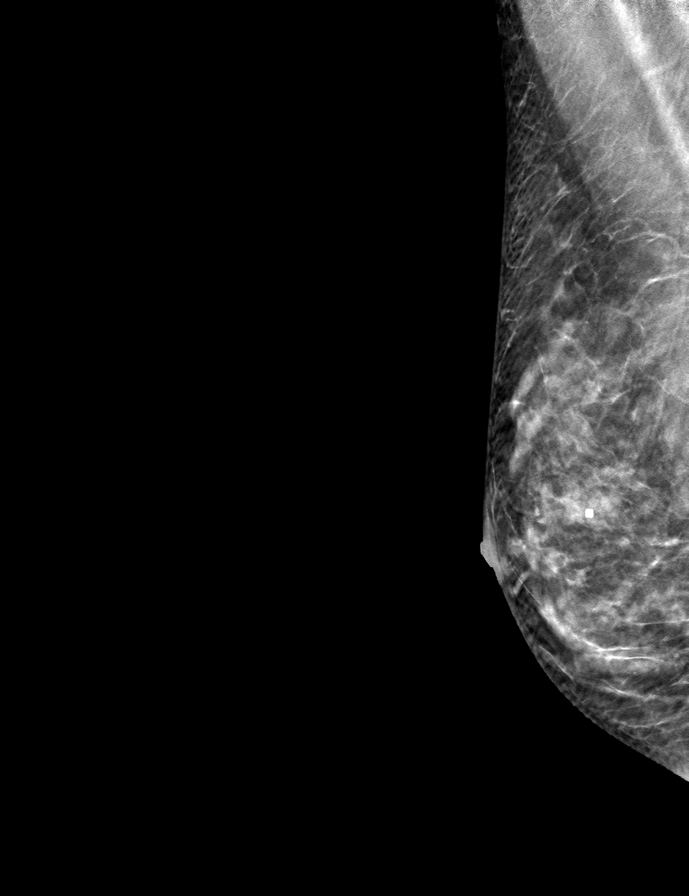

[9 of 24 positions shown; findings below may reference images not displayed]

ACR Breast Density Category d: The breast tissue is extremely dense,
which lowers the sensitivity of mammography
FINDINGS: There are no findings suspicious for malignancy. Images were
processed with CAD.
IMPRESSION: No mammographic evidence of malignancy. A result letter of this
screening mammogram will be mailed directly to the patient.

RECOMMENDATION:
Screening mammogram in one year. (Code:[id number])

BI-RADS CATEGORY  1: Negative.

## 2023-01-19 ENCOUNTER — Other Ambulatory Visit: Payer: Self-pay

## 2023-01-19 DIAGNOSIS — Z86018 Personal history of other benign neoplasm: Secondary | ICD-10-CM | POA: Diagnosis not present

## 2023-01-19 DIAGNOSIS — L578 Other skin changes due to chronic exposure to nonionizing radiation: Secondary | ICD-10-CM | POA: Diagnosis not present

## 2023-01-19 DIAGNOSIS — L404 Guttate psoriasis: Secondary | ICD-10-CM | POA: Diagnosis not present

## 2023-01-19 MED ORDER — TRIAMCINOLONE ACETONIDE 0.1 % EX CREA
TOPICAL_CREAM | CUTANEOUS | 0 refills | Status: DC
  Filled 2023-01-19: qty 80, 30d supply, fill #0

## 2023-03-28 DIAGNOSIS — Z780 Asymptomatic menopausal state: Secondary | ICD-10-CM | POA: Diagnosis not present

## 2023-03-28 DIAGNOSIS — Z124 Encounter for screening for malignant neoplasm of cervix: Secondary | ICD-10-CM | POA: Diagnosis not present

## 2023-03-28 DIAGNOSIS — Z13220 Encounter for screening for lipoid disorders: Secondary | ICD-10-CM | POA: Diagnosis not present

## 2023-03-28 DIAGNOSIS — Z130 Encounter for screening for diseases of the blood and blood-forming organs and certain disorders involving the immune mechanism: Secondary | ICD-10-CM | POA: Diagnosis not present

## 2023-03-28 DIAGNOSIS — Z113 Encounter for screening for infections with a predominantly sexual mode of transmission: Secondary | ICD-10-CM | POA: Diagnosis not present

## 2023-03-28 DIAGNOSIS — Z1151 Encounter for screening for human papillomavirus (HPV): Secondary | ICD-10-CM | POA: Diagnosis not present

## 2023-03-28 DIAGNOSIS — Z01419 Encounter for gynecological examination (general) (routine) without abnormal findings: Secondary | ICD-10-CM | POA: Diagnosis not present

## 2023-03-28 DIAGNOSIS — Z1329 Encounter for screening for other suspected endocrine disorder: Secondary | ICD-10-CM | POA: Diagnosis not present

## 2023-03-28 DIAGNOSIS — Z131 Encounter for screening for diabetes mellitus: Secondary | ICD-10-CM | POA: Diagnosis not present

## 2023-04-08 ENCOUNTER — Other Ambulatory Visit: Payer: Self-pay | Admitting: Nurse Practitioner

## 2023-04-08 DIAGNOSIS — Z1231 Encounter for screening mammogram for malignant neoplasm of breast: Secondary | ICD-10-CM

## 2023-04-12 DIAGNOSIS — Z1321 Encounter for screening for nutritional disorder: Secondary | ICD-10-CM | POA: Diagnosis not present

## 2023-04-12 DIAGNOSIS — Z01419 Encounter for gynecological examination (general) (routine) without abnormal findings: Secondary | ICD-10-CM | POA: Diagnosis not present

## 2023-04-12 DIAGNOSIS — Z13220 Encounter for screening for lipoid disorders: Secondary | ICD-10-CM | POA: Diagnosis not present

## 2023-04-27 ENCOUNTER — Encounter: Payer: Self-pay | Admitting: Vascular Surgery

## 2023-05-04 ENCOUNTER — Ambulatory Visit
Admission: RE | Admit: 2023-05-04 | Discharge: 2023-05-04 | Disposition: A | Discharge status: Home / Self Care | Payer: 59 | Source: Ambulatory Visit | Attending: Nurse Practitioner | Admitting: Nurse Practitioner

## 2023-05-04 ENCOUNTER — Other Ambulatory Visit: Payer: Self-pay

## 2023-05-04 DIAGNOSIS — Z1231 Encounter for screening mammogram for malignant neoplasm of breast: Secondary | ICD-10-CM | POA: Diagnosis not present

## 2023-05-04 DIAGNOSIS — H00012 Hordeolum externum right lower eyelid: Secondary | ICD-10-CM | POA: Diagnosis not present

## 2023-05-04 MED ORDER — NEOMYCIN-POLYMYXIN-DEXAMETH 3.5-10000-0.1 OP OINT
1.0000 | TOPICAL_OINTMENT | Freq: Four times a day (QID) | OPHTHALMIC | 0 refills | Status: DC
  Filled 2023-05-04: qty 3.5, 7d supply, fill #0

## 2023-06-10 ENCOUNTER — Encounter (HOSPITAL_COMMUNITY): Payer: 59

## 2023-06-10 ENCOUNTER — Encounter: Payer: 59 | Admitting: Vascular Surgery

## 2023-06-15 ENCOUNTER — Other Ambulatory Visit: Payer: Self-pay | Admitting: *Deleted

## 2023-06-15 DIAGNOSIS — I8393 Asymptomatic varicose veins of bilateral lower extremities: Secondary | ICD-10-CM

## 2023-06-27 NOTE — Progress Notes (Unsigned)
[facility] [place]  ASSESSMENT / PLAN: Hannah Chavez is a 62 y.o. female with chronic venous insufficiency of left lower extremity causing edema (C3 disease).  Venous duplex is significant for greater saphenous vein reflux and small saphenous vein reflux Recommend compression and elevation for symptomatic relief. Follow up in three months to discuss saphenous vein ablation.  CHIEF COMPLAINT: left leg varicosities  HISTORY OF PRESENT ILLNESS: Hannah Chavez is a 62 y.o. female who presents to clinic to evaluate bothersome varicosities in her left leg. The patient walks a lot during the course of a typical work day. She notices some swelling in her ankles and feet at the end of the day. She is bothered by the varicosities and prefers intervention. 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    Past Medical History:  Diagnosis Date   Orthodontics    Permanant retainer - top front   Vaginal Pap smear, abnormal    Wears contact lenses     Past Surgical History:  Procedure Laterality Date   COLONOSCOPY WITH PROPOFOL N/A 08/22/2015   Procedure: COLONOSCOPY WITH PROPOFOL;  Surgeon: Midge Minium, MD;  Location: [facility];  Service: Endoscopy;  Laterality: N/A;  PLEASE LEAVE PT EARLY AM PER DEE    Family History  Problem Relation Age of Onset   Breast cancer Maternal Grandmother 60   Scoliosis Mother    Cardiomyopathy Father    COPD Father    Emphysema Father     Social History   Socioeconomic History   Marital status: Married    Spouse name: Not on file   Number of children: Not on file   Years of education: Not on file   Highest education level: Not on file  Occupational History   Not on file  Tobacco Use   Smoking status: Former    Current packs/day: 0.00    Types: Cigarettes    Quit date: 08/30/1989    Years since quitting: 33.8   Smokeless tobacco: Never  Vaping Use    Vaping status: Never Used  Substance and Sexual Activity   Alcohol use: Yes    Comment: occas   Drug use: No   Sexual activity: Yes    Comment: partner-vasectomy  Other Topics Concern   Not on file  Social History Narrative   ** Merged History Encounter **       Social Determinants of Health   Financial Resource Strain: Not on file  Food Insecurity: Not on file  Transportation Needs: Not on file  Physical Activity: Not on file  Stress: Not on file  Social Connections: Not on file  Intimate Partner Violence: Not on file    No Known Allergies  Current Outpatient Medications  Medication Sig Dispense Refill   Biotin 10 MG TABS Take by mouth.     Calcium Citrate-Vitamin D (CALCIUM + D PO) Take by mouth.     cholecalciferol (VITAMIN D3) 25 MCG (1000 UNIT) tablet Take 1,000 Units by mouth daily.     Multiple Vitamin (MULTI-VITAMIN DAILY PO) Take by mouth.     Omega-3 Fatty Acids (FISH OIL PO) Take by mouth.     No current facility-administered medications for this visit.    PHYSICAL EXAM Vitals:   06/28/23 1243  BP: 113/70  Pulse: (!) 49  Resp: 18  Temp: 98.6 F (37 C)  SpO2: 100%  Weight: 132 lb (59.9 kg)  Height: 5'  9" (1.753 m)    Well appearing young woman in no distress Regular rate and rhythm Unlabored breathing Prominent medial varicosities in left leg - most notable about the calf 2+ DP pulses bilaterally.  PERTINENT LABORATORY AND RADIOLOGIC DATA  Most recent CBC    Latest Ref Rng & Units 10/14/2021    8:43 AM 08/19/2020    9:53 AM  CBC  WBC 3.4 - 10.8 x10E3/uL 4.6  6.2   Hemoglobin 11.1 - 15.9 g/dL 57.8  46.9   Hematocrit 34.0 - 46.6 % 42.4  42.1   Platelets 150 - 450 x10E3/uL 219  226      Most recent CMP    Latest Ref Rng & Units 10/14/2021    8:43 AM 08/19/2020    9:53 AM 05/16/2019    8:34 AM  CMP  Glucose 70 - 99 mg/dL 78  90  81   BUN 6 - 24 mg/dL 10  11  10    Creatinine 0.57 - 1.00 mg/dL 6.29  5.28  4.13   Sodium 134 - 144 mmol/L  140  141  140   Potassium 3.5 - 5.2 mmol/L 4.0  4.7  4.1   Chloride 96 - 106 mmol/L 101  99  100   CO2 20 - 29 mmol/L 25  27  26    Calcium 8.7 - 10.2 mg/dL 9.5  9.9  9.7   Total Protein 6.0 - 8.5 g/dL 7.4  7.6  7.2   Total Bilirubin 0.0 - 1.2 mg/dL 0.3  0.6  0.4   Alkaline Phos 44 - 121 IU/L 55  54  46   AST 0 - 40 IU/L 26  33  33   ALT 0 - 32 IU/L 15  23  17      Renal function CrCl cannot be calculated (Patient's most recent lab result is older than the maximum 21 days allowed.).  Hgb A1c MFr Bld (%)  Date Value  08/19/2020 5.5    LDL Chol Calc (NIH)  Date Value Ref Range Status  10/14/2021 117 (H) 0 - 99 mg/dL Final    Left:  - No evidence of deep vein thrombosis seen in the left lower extremity,  from the common femoral through the popliteal veins.  - No evidence of superficial venous thrombosis in the left lower  extremity.  - Venous reflux is noted in the left common femoral vein.  - Venous reflux is noted in the left sapheno-femoral junction.  - Venous reflux is noted in the left greater saphenous vein in the thigh.  - Venous reflux is noted in the left greater saphenous vein in the calf.  - Venous reflux is noted in the left short saphenous vein.   Rande Brunt. Lenell Antu, MD [facility] Phone Number: [phone] 06/28/2023 4:25 PM   Total time spent on preparing this encounter including chart review, data review, collecting history, examining the patient, coordinating care for this new patient, 60 minutes.  Portions of this report may have been transcribed using voice recognition software.  Every effort has been made to ensure accuracy; however, inadvertent computerized transcription errors may still be present.

## 2023-06-28 ENCOUNTER — Ambulatory Visit: Payer: 59 | Admitting: Vascular Surgery

## 2023-06-28 ENCOUNTER — Ambulatory Visit (HOSPITAL_COMMUNITY)
Admission: RE | Admit: 2023-06-28 | Discharge: 2023-06-28 | Disposition: A | Discharge status: Home / Self Care | Payer: 59 | Source: Ambulatory Visit | Attending: Vascular Surgery | Admitting: Vascular Surgery

## 2023-06-28 ENCOUNTER — Encounter: Payer: Self-pay | Admitting: Vascular Surgery

## 2023-06-28 VITALS — BP 113/70 | HR 49 | Temp 98.6°F | Resp 18 | Ht 69.0 in | Wt 132.0 lb

## 2023-06-28 DIAGNOSIS — I8393 Asymptomatic varicose veins of bilateral lower extremities: Secondary | ICD-10-CM | POA: Diagnosis not present

## 2023-06-28 DIAGNOSIS — I872 Venous insufficiency (chronic) (peripheral): Secondary | ICD-10-CM

## 2023-07-06 ENCOUNTER — Telehealth: Payer: Self-pay | Admitting: Surgery

## 2023-07-06 NOTE — Telephone Encounter (Signed)
lvm to r/s appt with [facility] on 10/14 due to him not being here.

## 2023-09-26 ENCOUNTER — Ambulatory Visit: Payer: 59 | Admitting: Surgery

## 2023-10-10 ENCOUNTER — Encounter: Payer: Self-pay | Admitting: Surgery

## 2023-10-10 ENCOUNTER — Ambulatory Visit (INDEPENDENT_AMBULATORY_CARE_PROVIDER_SITE_OTHER): Payer: 59 | Admitting: Surgery

## 2023-10-10 VITALS — BP 132/81 | HR 55 | Temp 98.3°F | Ht 69.0 in | Wt 131.0 lb

## 2023-10-10 DIAGNOSIS — I83812 Varicose veins of left lower extremities with pain: Secondary | ICD-10-CM

## 2023-10-10 NOTE — Progress Notes (Signed)
[facility] and Vein Specialist of [facility]  Patient name: Hannah Chavez MRN: [id number] DOB: 08/24/1961 Sex: female   REASON FOR VISIT:    Follow up varicose vein  HISOTRY OF PRESENT ILLNESS:    Hannah Chavez is a 62 y.o. female who was initially seen in July 2024 by Dr. Lenell Antu for evaluation of C3 venous disease with left leg edema.  Her duplex ultrasound showed saphenous vein reflux.  She is referred to the vein clinic for follow-up evaluation.  The patient walks a lot during the day.  She notices swelling at the end of the day.  She also complains that her legs ache at the end of the day.  She complains of irritation of her varicosities.  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   PAST MEDICAL HISTORY:   Past Medical History:  Diagnosis Date   Orthodontics    Permanant retainer - top front   Vaginal Pap smear, abnormal    Wears contact lenses      FAMILY HISTORY:   Family History  Problem Relation Age of Onset   Breast cancer Maternal Grandmother 4   Scoliosis Mother    Cardiomyopathy Father    COPD Father    Emphysema Father     SOCIAL HISTORY:   Social History   Tobacco Use   Smoking status: Former    Current packs/day: 0.00    Types: Cigarettes    Quit date: 08/30/1989    Years since quitting: 34.1   Smokeless tobacco: Never  Substance Use Topics   Alcohol use: Yes    Comment: occas     ALLERGIES:   No Known Allergies   CURRENT MEDICATIONS:   Current Outpatient Medications  Medication Sig Dispense Refill   Biotin 10 MG TABS Take by mouth.     Calcium Citrate-Vitamin D (CALCIUM + D PO) Take by mouth.     cholecalciferol (VITAMIN D3) 25 MCG (1000 UNIT) tablet Take 1,000 Units by mouth daily.     Multiple Vitamin (MULTI-VITAMIN DAILY PO) Take by mouth.     Omega-3 Fatty Acids (FISH OIL PO) Take by mouth.     No current facility-administered medications for this visit.     REVIEW OF SYSTEMS:   [X]  denotes positive finding, [ ]  denotes negative finding Cardiac  Comments:  Chest pain or chest pressure:    Shortness of breath upon exertion:    Short of breath when lying flat:    Irregular heart rhythm:        Vascular    Pain in calf, thigh, or hip brought on by ambulation:    Pain in feet at night that wakes you up from your sleep:     Blood clot in your veins:    Leg swelling:  x       Pulmonary    Oxygen at home:    Productive cough:     Wheezing:         Neurologic    Sudden weakness in arms or legs:     Sudden numbness in arms or legs:     Sudden onset of difficulty speaking or slurred speech:    Temporary loss of vision in one eye:     Problems with dizziness:         Gastrointestinal    Blood in stool:     Vomited blood:         Genitourinary    Burning when urinating:  Blood in urine:        Psychiatric    Major depression:         Hematologic    Bleeding problems:    Problems with blood clotting too easily:        Skin    Rashes or ulcers:        Constitutional    Fever or chills:      PHYSICAL EXAM:   Vitals:   10/10/23 0945  BP: 132/81  Pulse: (!) 55  Temp: 98.3 F (36.8 C)  SpO2: 98%  Weight: 131 lb (59.4 kg)  Height: 5\' 9"  (1.753 m)    GENERAL: The patient is a well-nourished female, in no acute distress. The vital signs are documented above. CARDIAC: There is a regular rate and rhythm.  VASCULAR: SonoSite was used to evaluate her saphenous vein.  About the mid thigh goes outside of the fascia and runs up close to the skin.  She has several large dilated varicosities on the medial calf. PULMONARY: Non-labored respirations MUSCULOSKELETAL: There are no major deformities or cyanosis. NEUROLOGIC: No focal weakness or paresthesias are detected. SKIN: See photos below PSYCHIATRIC: The patient has a normal affect.       STUDIES:   I have reviewed the following reflux  study: +--------------+---------+------+----------+-----------+-------------------  ---+  LEFT         Reflux NoReflux  Reflux   Diameter  Comments                                         Yes     Time       cms                             +--------------+---------+------+----------+-----------+-------------------  ---+  CFV                    yes  >1 second                                     +--------------+---------+------+----------+-----------+-------------------  ---+  FV mid        no                                                           +--------------+---------+------+----------+-----------+-------------------  ---+  Popliteal    no                                                           +--------------+---------+------+----------+-----------+-------------------  ---+  GSV at [facility]              yes   >500 ms     0.54                             +--------------+---------+------+----------+-----------+-------------------  ---+  GSV prox thigh  yes   >500 ms     0.83                             +--------------+---------+------+----------+-----------+-------------------  ---+  GSV mid thigh           yes   >500 ms     0.96    tortuous, out of                                                           fascia                   +--------------+---------+------+----------+-----------+-------------------  ---+  GSV dist thigh          yes   >500 ms     0.54                             +--------------+---------+------+----------+-----------+-------------------  ---+  GSV at knee             yes   >500 ms     0.54                             +--------------+---------+------+----------+-----------+-------------------  ---+  GSV prox calf           yes   >500 ms     0.34    varicosities  noted.     +--------------+---------+------+----------+-----------+-------------------  ---+   GSV mid calf  no                          0.30    back in fascia           +--------------+---------+------+----------+-----------+-------------------  ---+  SSV Pop Fossa           yes   >500 ms     0.20    distal thigh             +--------------+---------+------+----------+-----------+-------------------  ---+  SSV prox calf           yes   >500 ms     0.18                             +--------------+---------+------+----------+-----------+-------------------  ---+  SSV mid calf            yes   >500 ms     0.23                             +--------------+---------+------+----------+-----------+-------------------  ---+  AASV O        no                          0.25                             +--------------+---------+------+----------+-----------+-------------------  ---+  AASV P        no  0.22                             +--------------+---------+------+----------+-----------+-------------------  ---+  AASV M                                            too small                +--------------+---------+------+----------+-----------+-------------------  ---+       MEDICAL ISSUES:   CEAP class III, left leg: The patient has a chronic aching and discomfort in her left leg particular around her varicosities.  She also has left leg swelling.  Ultrasound today shows significant saphenous reflux without deep vein reflux.  On SonoSite evaluation, the vein becomes extrafascial in the mid thigh.  I am placing her into 20-30 thigh-high compression stockings today.  I have encouraged her to continue her exercise program which already consists of weights and aerobic activity including walking.  She was also told to keep her legs elevated when possible and to take ibuprofen for discomfort.  She will follow-up in 3 months for discussions of endovenous laser ablation and stab phlebectomy    Charlena Cross, MD,  FACS [facility] [phone] Pager [phone]

## 2024-01-16 ENCOUNTER — Ambulatory Visit: Payer: 59 | Admitting: Surgery

## 2024-01-25 ENCOUNTER — Other Ambulatory Visit: Payer: Self-pay

## 2024-01-25 DIAGNOSIS — L404 Guttate psoriasis: Secondary | ICD-10-CM | POA: Diagnosis not present

## 2024-01-25 DIAGNOSIS — L578 Other skin changes due to chronic exposure to nonionizing radiation: Secondary | ICD-10-CM | POA: Diagnosis not present

## 2024-01-25 DIAGNOSIS — Z86018 Personal history of other benign neoplasm: Secondary | ICD-10-CM | POA: Diagnosis not present

## 2024-01-25 MED ORDER — TRIAMCINOLONE ACETONIDE 0.1 % EX CREA
1.0000 | TOPICAL_CREAM | Freq: Two times a day (BID) | CUTANEOUS | 0 refills | Status: AC
  Filled 2024-01-25: qty 80, 40d supply, fill #0

## 2024-04-16 DIAGNOSIS — H524 Presbyopia: Secondary | ICD-10-CM | POA: Diagnosis not present

## 2024-04-16 DIAGNOSIS — H5203 Hypermetropia, bilateral: Secondary | ICD-10-CM | POA: Diagnosis not present

## 2024-07-12 DIAGNOSIS — Z1329 Encounter for screening for other suspected endocrine disorder: Secondary | ICD-10-CM | POA: Diagnosis not present

## 2024-07-12 DIAGNOSIS — Z13220 Encounter for screening for lipoid disorders: Secondary | ICD-10-CM | POA: Diagnosis not present

## 2024-07-12 DIAGNOSIS — Z131 Encounter for screening for diabetes mellitus: Secondary | ICD-10-CM | POA: Diagnosis not present

## 2024-07-12 DIAGNOSIS — Z130 Encounter for screening for diseases of the blood and blood-forming organs and certain disorders involving the immune mechanism: Secondary | ICD-10-CM | POA: Diagnosis not present

## 2024-07-12 DIAGNOSIS — Z01419 Encounter for gynecological examination (general) (routine) without abnormal findings: Secondary | ICD-10-CM | POA: Diagnosis not present
